# Patient Record
Sex: Female | Born: 1980 | Race: White | Hispanic: No | Marital: Single | Smoking: Current every day smoker
Health system: Southern US, Community
[De-identification: ages and names within clinical notes are randomized; demographics above are authoritative.]

## PROBLEM LIST (undated history)

## (undated) DIAGNOSIS — I10 Essential (primary) hypertension: Secondary | ICD-10-CM

## (undated) DIAGNOSIS — C569 Malignant neoplasm of unspecified ovary: Secondary | ICD-10-CM

## (undated) DIAGNOSIS — F419 Anxiety disorder, unspecified: Secondary | ICD-10-CM

---
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
## 2002-03-16 ENCOUNTER — Other Ambulatory Visit: Admission: RE | Admit: 2002-03-16 | Discharge: 2002-03-16 | Payer: Self-pay | Admitting: Obstetrics and Gynecology

## 2002-03-17 ENCOUNTER — Encounter: Payer: Self-pay | Admitting: Obstetrics and Gynecology

## 2002-03-17 ENCOUNTER — Ambulatory Visit (HOSPITAL_COMMUNITY): Admission: RE | Admit: 2002-03-17 | Discharge: 2002-03-17 | Payer: Self-pay | Admitting: Obstetrics and Gynecology

## 2002-03-23 ENCOUNTER — Emergency Department (HOSPITAL_COMMUNITY): Admission: EM | Admit: 2002-03-23 | Discharge: 2002-03-23 | Payer: Self-pay | Admitting: Emergency Medicine

## 2002-04-07 ENCOUNTER — Ambulatory Visit (HOSPITAL_COMMUNITY): Admission: RE | Admit: 2002-04-07 | Discharge: 2002-04-07 | Payer: Self-pay | Admitting: Obstetrics and Gynecology

## 2002-04-07 ENCOUNTER — Encounter: Payer: Self-pay | Admitting: Obstetrics and Gynecology

## 2002-06-23 ENCOUNTER — Ambulatory Visit (HOSPITAL_COMMUNITY): Admission: RE | Admit: 2002-06-23 | Discharge: 2002-06-23 | Payer: Self-pay | Admitting: Obstetrics and Gynecology

## 2002-06-23 ENCOUNTER — Encounter: Payer: Self-pay | Admitting: Obstetrics and Gynecology

## 2002-06-30 ENCOUNTER — Inpatient Hospital Stay (HOSPITAL_COMMUNITY): Admission: AD | Admit: 2002-06-30 | Discharge: 2002-06-30 | Payer: Self-pay | Admitting: Obstetrics and Gynecology

## 2002-07-18 ENCOUNTER — Inpatient Hospital Stay (HOSPITAL_COMMUNITY): Admission: AD | Admit: 2002-07-18 | Discharge: 2002-07-18 | Payer: Self-pay | Admitting: Obstetrics and Gynecology

## 2002-07-20 ENCOUNTER — Encounter: Payer: Self-pay | Admitting: Obstetrics and Gynecology

## 2002-07-20 ENCOUNTER — Inpatient Hospital Stay (HOSPITAL_COMMUNITY): Admission: AD | Admit: 2002-07-20 | Discharge: 2002-07-20 | Payer: Self-pay | Admitting: Obstetrics and Gynecology

## 2002-07-21 ENCOUNTER — Encounter: Admission: RE | Admit: 2002-07-21 | Discharge: 2002-07-21 | Payer: Self-pay | Admitting: Obstetrics and Gynecology

## 2002-07-25 ENCOUNTER — Encounter: Admission: RE | Admit: 2002-07-25 | Discharge: 2002-07-25 | Payer: Self-pay | Admitting: Obstetrics and Gynecology

## 2002-07-25 ENCOUNTER — Ambulatory Visit (HOSPITAL_COMMUNITY): Admission: RE | Admit: 2002-07-25 | Discharge: 2002-07-25 | Payer: Self-pay | Admitting: *Deleted

## 2002-07-28 ENCOUNTER — Encounter: Admission: RE | Admit: 2002-07-28 | Discharge: 2002-07-28 | Payer: Self-pay | Admitting: Obstetrics and Gynecology

## 2002-08-01 ENCOUNTER — Inpatient Hospital Stay (HOSPITAL_COMMUNITY): Admission: AD | Admit: 2002-08-01 | Discharge: 2002-08-01 | Payer: Self-pay | Admitting: Obstetrics and Gynecology

## 2002-08-04 ENCOUNTER — Encounter: Admission: RE | Admit: 2002-08-04 | Discharge: 2002-08-04 | Payer: Self-pay | Admitting: Obstetrics and Gynecology

## 2002-08-07 ENCOUNTER — Inpatient Hospital Stay (HOSPITAL_COMMUNITY): Admission: AD | Admit: 2002-08-07 | Discharge: 2002-08-11 | Payer: Self-pay | Admitting: Obstetrics and Gynecology

## 2002-08-07 ENCOUNTER — Encounter (INDEPENDENT_AMBULATORY_CARE_PROVIDER_SITE_OTHER): Payer: Self-pay | Admitting: *Deleted

## 2002-08-12 ENCOUNTER — Encounter: Admission: RE | Admit: 2002-08-12 | Discharge: 2002-09-11 | Payer: Self-pay | Admitting: Obstetrics and Gynecology

## 2003-04-25 ENCOUNTER — Other Ambulatory Visit: Admission: RE | Admit: 2003-04-25 | Discharge: 2003-04-25 | Payer: Self-pay | Admitting: Obstetrics and Gynecology

## 2003-05-04 ENCOUNTER — Ambulatory Visit (HOSPITAL_COMMUNITY): Admission: RE | Admit: 2003-05-04 | Discharge: 2003-05-04 | Payer: Self-pay | Admitting: Obstetrics and Gynecology

## 2004-10-03 ENCOUNTER — Ambulatory Visit: Payer: Self-pay | Admitting: Family Medicine

## 2005-03-31 ENCOUNTER — Other Ambulatory Visit: Admission: RE | Admit: 2005-03-31 | Discharge: 2005-03-31 | Payer: Self-pay | Admitting: Obstetrics and Gynecology

## 2005-07-30 ENCOUNTER — Inpatient Hospital Stay (HOSPITAL_COMMUNITY): Admission: AD | Admit: 2005-07-30 | Discharge: 2005-07-30 | Payer: Self-pay | Admitting: Obstetrics and Gynecology

## 2005-09-01 ENCOUNTER — Other Ambulatory Visit: Admission: RE | Admit: 2005-09-01 | Discharge: 2005-09-01 | Payer: Self-pay | Admitting: Obstetrics and Gynecology

## 2005-09-29 ENCOUNTER — Inpatient Hospital Stay (HOSPITAL_COMMUNITY): Admission: AD | Admit: 2005-09-29 | Discharge: 2005-09-29 | Payer: Self-pay | Admitting: Obstetrics and Gynecology

## 2005-10-02 ENCOUNTER — Inpatient Hospital Stay (HOSPITAL_COMMUNITY): Admission: RE | Admit: 2005-10-02 | Discharge: 2005-10-05 | Payer: Self-pay | Admitting: Obstetrics and Gynecology

## 2005-10-09 ENCOUNTER — Inpatient Hospital Stay (HOSPITAL_COMMUNITY): Admission: AD | Admit: 2005-10-09 | Discharge: 2005-10-09 | Payer: Self-pay | Admitting: Obstetrics and Gynecology

## 2005-10-13 ENCOUNTER — Ambulatory Visit (HOSPITAL_COMMUNITY): Admission: RE | Admit: 2005-10-13 | Discharge: 2005-10-13 | Payer: Self-pay | Admitting: Obstetrics and Gynecology

## 2007-01-25 ENCOUNTER — Emergency Department (HOSPITAL_COMMUNITY): Admission: EM | Admit: 2007-01-25 | Discharge: 2007-01-25 | Payer: Self-pay | Admitting: Emergency Medicine

## 2007-05-07 ENCOUNTER — Inpatient Hospital Stay (HOSPITAL_COMMUNITY): Admission: EM | Admit: 2007-05-07 | Discharge: 2007-05-08 | Payer: Self-pay | Admitting: Emergency Medicine

## 2007-09-06 ENCOUNTER — Emergency Department (HOSPITAL_COMMUNITY): Admission: EM | Admit: 2007-09-06 | Discharge: 2007-09-07 | Payer: Self-pay | Admitting: Emergency Medicine

## 2009-10-27 ENCOUNTER — Ambulatory Visit: Payer: Self-pay | Admitting: Diagnostic Radiology

## 2009-10-27 ENCOUNTER — Emergency Department (HOSPITAL_BASED_OUTPATIENT_CLINIC_OR_DEPARTMENT_OTHER): Admission: EM | Admit: 2009-10-27 | Discharge: 2009-10-27 | Payer: Self-pay | Admitting: Emergency Medicine

## 2010-05-01 LAB — URINALYSIS, ROUTINE W REFLEX MICROSCOPIC
Glucose, UA: NEGATIVE mg/dL
pH: 6 (ref 5.0–8.0)

## 2010-05-01 LAB — URINE MICROSCOPIC-ADD ON

## 2010-07-01 NOTE — Discharge Summary (Signed)
NAMEISRAEL, WUNDER           ACCOUNT NO.:  [id number]   MEDICAL RECORD NO.:  [id number]          PATIENT TYPE:  INP   LOCATION:  A228                          FACILITY:  [facility]   PHYSICIAN:  Dorris Singh, DO    DATE OF BIRTH:  [birth year]   DATE OF ADMISSION:  05/07/2007  DATE OF DISCHARGE:  03/22/2009LH                               DISCHARGE SUMMARY   DIAGNOSIS:  Acetaminophen overdose/suicide attempt.   DISCHARGE DIAGNOSES:  1. Acetaminophen overdose/suicide attempt.  2. Tobacco abuse.   PRIMARY CARE PHYSICIAN:  Esaw Grandchild, MD   HISTORY OF PRESENT ILLNESS:  To summarize, the patient is a 30 year old  woman who was brought to the emergency room via EMS after attempted  suicide with acetaminophen overdose; she also took Xanax as well as  alcohol.  Her level was toxic.  She was given charcoal, which produced  vomitus, and then also Mucomyst in the ED.  Her acetaminophen levels  were followed until they went down to normal.  She was given aggressive  IV hydration and the ACT team saw her.  We continue to monitor her vital  signs, which remained stable, as well as her acetaminophen level, which  continue to decrease.  Upon admission, her acetaminophen level was 60.8  and today it is less than 10.  For March 22, it was determined the  patient has remained stable.  We will go ahead and have her ready for  discharge.  She does have hypokalemia, but this will be due to his  aggressive IV hydration.  We will go ahead and discontinue her IV and  supplement her with p.o. potassium and increase her diet.   MEDICATIONS:  Since she was not any medications at admission, she will  not be sent to [facility] on any medications at this point in  time, other than the recommendation of a nicotine patch when she is over  at [facility].   CONDITION ON DISCHARGE:  Her condition is stable.   DISPOSITION:  Her disposition will be determined by the ACT team at this  point in  time, which should be to the behavioral health facility.      Dorris Singh, DO  Electronically Signed     CB/MEDQ  D:  05/08/2007  T:  05/08/2007  Job:  [id number]
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
## 2010-07-01 NOTE — Discharge Summary (Signed)
[name], Sarah [name]           ACCOUNT NO.:  [id number]   MEDICAL RECORD NO.:  [id number]          PATIENT TYPE:  INP   LOCATION:  A228                          FACILITY:  [facility]   PHYSICIAN:  Dorris Singh, DO    DATE OF BIRTH:  05-10-1980   DATE OF ADMISSION:  05/07/2007  DATE OF DISCHARGE:  LH                               DISCHARGE SUMMARY   NO DICTATION.      Dorris Singh, DO  Electronically Signed     CB/MEDQ  D:  05/08/2007  T:  05/08/2007  Job:  [id number]

## 2010-07-04 NOTE — Discharge Summary (Signed)
Sarah [name], Sarah [name]                     ACCOUNT NO.:  [id number]   MEDICAL RECORD NO.:  [id number]                   PATIENT TYPE:  INP   LOCATION:  9125                                 FACILITY:  [facility]   PHYSICIAN:  Zenaida Niece, M.D.             DATE OF BIRTH:  [birth year]   DATE OF ADMISSION:  08/07/2002  DATE OF DISCHARGE:  08/11/2002                                 DISCHARGE SUMMARY   ADMISSION DIAGNOSES:  1. Intrauterine pregnancy at 36 weeks, breech.  2. Intrauterine growth retardation.   DISCHARGE DIAGNOSES:  1. Intrauterine pregnancy at 36 weeks, breech.  2. Intrauterine growth retardation.   PROCEDURE:  Primary low-transverse cesarean section.   HISTORY AND PHYSICAL:  This is a 30 year old white female gravida 2, para 1-  0-0-1 with an EGA of [redacted] weeks, who presents for elective cesarean section  due to breech presentation and suspected IUGR.  Prenatal care complicated by  late care at approximately 15 weeks, persistent breech presentation.  Ultrasound on May 7 with an estimated fetal weight of approximately 1170  grams and an AFI of 10.9 with a followup ultrasound consistent with still  IUGR.  She has had reactive nonstress test for this and has been treated  with Zantac for reflux.   PRENATAL LABS:  Blood type is O negative with a negative antibody screen.  RPR nonreactive.  Rubella immune.  Hepatitis B surface antigen is negative.  HIV negative.  Gonorrhea and Chlamydia negative.  One hour Glucola is 70.   PAST OB HISTORY:  In 2002, a vaginal delivery at 36 weeks, 5 pounds 1 ounce,  complicated by pre-eclampsia.   ALLERGIES:  1. KEFLEX.  2. AUGMENTIN.   MEDICATIONS:  Zantac 150 mg b.i.d.   SOCIAL HISTORY:  She does smoke less than a one-half pack of cigarettes a  day and is single.   FAMILY HISTORY:  Patient has a niece that died with Hurler syndrome.   PHYSICAL EXAMINATION:  VITAL SIGNS:  Afebrile, stable vital signs.  ABDOMEN:  Gravid with an  estimated fetal weight of 5 pounds and ultrasound  confirms breech presentation.   HOSPITAL COURSE:  Patient was admitted and underwent a primary low-  transverse cesarean section without extensions on her spinal anesthesia with  an estimated blood loss of 800 mL.  Patient had normal anatomy and delivered  a viable female infant with Apgars of 8 and 9 and a weight of 4 pounds 2  ounces and was in the frank breech presentation.   Postoperatively, patient did well, was rapidly able to ambulate and tolerate  a regular diet.   Pre-delivery hemoglobin 13.4, post-delivery 11.5.   Baby spent the first three days in the NICU due to difficulty controlling  blood sugars but was then transferred back to the newborn nursery.   On the morning of postoperative day #4, the patient was felt to be stable  enough for discharge home.  Her  incision was healing well and her __________  subcuticular suture was removed.   DISCHARGE INSTRUCTIONS:  1. Regular diet.  2. Pelvic rest.  3. No strenuous activity.  4. Follow up is in 10 days for an incision check.   MEDICATIONS:  1. Percocet #30, 1-2 p.o. q.4-6h. p.r.n. pain.  2. Over the counter Motrin as needed.   She is also given our discharge pamphlet.                                                Zenaida Niece, M.D.    TDM/MEDQ  D:  08/11/2002  T:  08/12/2002  Job:  [id number]

## 2010-07-04 NOTE — Discharge Summary (Signed)
[facility]  Patient:    Sarah [name], Sarah [name]                    MRN: [id number] Adm. Date:  [id number] Disc. Date: 08/20/00 Attending:  Michaele Offer                           Discharge Summary  ADMISSION DIAGNOSES:          1. Intrauterine pregnancy at 36 weeks.                               2. Preeclampsia.  DISCHARGE DIAGNOSES:          1. Intrauterine pregnancy at 36 weeks.                               2. Preeclampsia.  PROCEDURES:                   Spontaneous vaginal delivery.  COMPLICATIONS:                None.  CONSULTATIONS:                None.  HISTORY AND PHYSICAL:         This is an 30 year old white female, gravida 1, para 0 with an EGA of [redacted] weeks by second trimester ultrasound with an [facility] of July 30 who presents for induction due to PIH/preeclampsia and a favorable cervix. The patient has had minimal symptoms but persistently elevated blood pressure up to 170s/120s with 2+ proteinuria and 3+ edema in the office today. She has had normal labs, a reactive nonstress test, and normal growth by the ultrasound with an estimated fetal weight of 2500 g and a normal AFI. Prenatal care complicated by a second trimester upper respiratory infection treated with Zithromax and the elevated blood pressures with intermittent proteinuria since 34 weeks.  PRENATAL LABORATORY DATA:     Blood type O negative with a negative antibody screen, RPR nonreactive, rubella immune, hepatitis B surface antigen negative, HIV negative, gonorrhea and Chlamydia negative, triple screen normal, one-hour glucola 112.  PAST MEDICAL HISTORY:         Significant only for allergies listed below.  ALLERGIES:                    KEFLEX and AUGMENTIN.  SOCIAL HISTORY:               She is single and does smoke one half pack of cigarettes a day.  PHYSICAL EXAMINATION:         She was afebrile with blood pressures of up to 150s/110. Fetal heart tracing was  reactive with irregular contractions. Abdomen was gravid, nontender with an estimated fetal weight of less than 6 pounds. Extremities had 3+ edema, were nontender, DTRs were 2/4 and symmetric and she had no clonus. Vaginal exam in the hospital was 3/80/0 with a vertex presentation and an adequate pelvis, and on my first exam in the hospital, I was able to perform artificial rupture of membranes which revealed clear amniotic fluid.  HOSPITAL COURSE:              The patient was admitted and started on magnesium sulfate for seizure prophylaxis and started on low-dose Pitocin. With that, on my first  exam, I was able to perform artificial rupture of membranes. As I just did her group B strep culture in the office on the day of admission, she was started on clindamycin for group B strep prophylaxis. She continued to labor and received an epidural. She progressed to complete and pushed well. On the evening of July 2, she had an SVD of a viable female infant with Apgars of 8 and 9 that weighed 5 pounds 1 ounce over bilateral labial lacerations and through a shoulder cord. Placenta delivered spontaneous, was intact, and was sent to pathology. Her labial lacerations were repaired partially with 3-0 Vicryl for hemostasis. Estimated blood loss was 500 cc. After delivery the patient complained of dyspnea, although her O2 saturation was 99% on room air. Reflexes were 3/4, not consistent with magnesium toxicity. Her blood pressure did go up to 160-170/120 again and this was treated with 20 mg of labetalol which controlled her blood pressure and relieved her symptoms. She was continued on the magnesium sulfate. Early on the morning of July 3 she had some persistent uterine bleeding and this was treated with a shot of Hemabate which did resolve the bleeding.  On the morning of postpartum day #1, blood pressures were excellent at 130s-140s/70-100 and she had very good urine output. Fundus was  firm. Extremities had 2+ edema and DTRs were still 3/4 on the magnesium and she had no clonus. Labs revealed a hemoglobin that went from 12.8 to 10.3, platelets had gone from 165,000 down to 106,000. Creatinine was stable at 0.7. SGOT was up to 150. SGPT was up to 56. Uric acid was up to 5.6 and LDH was up to 855. She was continued on the magnesium sulfate throughout July 3 for reevaluation that evening. Dr. Ambrose Mantle evaluated her that evening and due to some respiratory distress and the fact that she had abnormal labs, he elected to continue her on her magnesium sulfate. Chest x-ray revealed possible fluid overload and she was apparently treated with Lasix. She had excellent diuresis of over two liters on the night of July 3. On the morning of July 4, her blood pressure was still stable and she felt less respiratory distress. Lungs had slightly decreased breath sounds at the right base but no rales. Hemoglobin was down to 8.2 and platelets were down to 87,000. The remainder of her labs were all stable or improved. Her magnesium sulfate was discontinued. She was observed in the AICU and then transferred to the floor. Once she was on the floor she did very well, continued to have normal blood pressures and remained afebrile. On the morning of postpartum day #3, she was felt to be stable enough for discharge home.  CONDITION ON DISCHARGE:       Stable.  DISPOSITION:                  Discharge to home.  DISCHARGE INSTRUCTIONS:       Regular diet. Activity: Pelvic rest.  DISCHARGE FOLLOWUP:           The patient is to follow up in four to six weeks.  DISCHARGE MEDICATIONS:        Percocet and over-the-counter Motrin p.r.n. pain and she is given our discharge pamphlet. DD:  08/20/00 TD:  08/20/00 Job: [id number]

## 2010-07-04 NOTE — Op Note (Signed)
Sarah [name], Sarah [name]                     ACCOUNT NO.:  [id number]   MEDICAL RECORD NO.:  [id number]                   PATIENT TYPE:  INP   LOCATION:  9198                                 FACILITY:  [facility]   PHYSICIAN:  Zenaida Niece, M.D.             DATE OF BIRTH:  [birth year]   DATE OF PROCEDURE:  08/07/2002  DATE OF DISCHARGE:                                 OPERATIVE REPORT   PREOPERATIVE DIAGNOSES:  1. Intrauterine pregnancy at 36 weeks.  2. Breech presentation.  3. Intrauterine growth restriction.   POSTOPERATIVE DIAGNOSES:  1. Intrauterine pregnancy at 36 weeks.  2. Breech presentation.  3. Intrauterine growth restriction.   PROCEDURE:  Primary low transverse cesarean section.   SURGEON:  Zenaida Niece, M.D.   ASSISTANT:  Malachi Pro. Ambrose Mantle, M.D.   ANESTHESIA:  Spinal.   ESTIMATED BLOOD LOSS:  800 mL.   FINDINGS:  The patient had normal female anatomy.  She delivered a viable  female infant with Apgars of 8 and 9 that weighed 4 pounds 2 ounces.   PROCEDURE IN DETAIL:  The patient was taken to the operating room and placed  in the sitting position.  John A. Maisie Fus, M.D., instilled spinal anesthesia  and she was placed in the dorsal supine position with left lateral tilt.  Her abdomen was prepped and draped in the usual sterile fashion and a Foley  catheter inserted.  The level of her anesthesia was found to be just halfway  between pubic and umbilicus.  The skin was infiltrated with 0.25% Marcaine.  Her abdomen was then entered via a standard Pfannenstiel incision without  complications.  The vesicouterine peritoneum was incised and the bladder  flap created digitally.  A 4 cm transverse incision was made in the lower  uterine segment.  This was then extended bilaterally bluntly.  The membranes  were ruptured with an Allis and revealed clear fluid.  The fetal breech was  grasped and delivered through the incision atraumatically.  The baby  delivered very  easily to the shoulders, then the head very easily delivered.  The mouth and nares were suctioned, then the cord was doubly clamped and  cut.  The infant was handed to the awaiting pediatric team.  Cord blood and  a segment of cord for gas were obtained, and the placenta delivered  spontaneously.  The placenta was sent to pathology.  The uterus was wiped  dry with a clean lap pad and all clots and debris removed.  The uterine  incision was inspected and found to be free of extensions.  The uterine  incision was closed with one layer being a running locking layer with #1  chromic.  Bleeding from the left edge was controlled with a figure-of-eight  suture of #1 chromic.  Both tubes and ovaries were inspected and found to be  normal.  The uterine incision was again inspected and found to be  hemostatic.  The subfascial space was irrigated and made hemostatic with  electrocautery.  The superior portion of the rectus muscles was  reapproximated with interrupted sutures of #1 chromic due to prolapsing  omentum.  The fascia was closed in a running fashion starting at both ends  and meeting in the middle with 0 Vicryl.  The subcutaneous tissue was  irrigated and made hemostatic with electrocautery.  The skin was closed with  running subcuticular suture of 4-0 Prolene, followed by Steri-Strips.  The  patient tolerated the procedure well and was taken to the recovery room in  stable condition.  The counts were correct x2, and she was given Clindamycin  after cord clamp.                                               Zenaida Niece, M.D.    TDM/MEDQ  D:  08/07/2002  T:  08/07/2002  Job:  [id number]

## 2010-07-04 NOTE — Op Note (Signed)
Sarah, [name]           ACCOUNT NO.:  [id number]   MEDICAL RECORD NO.:  [id number]          PATIENT TYPE:  AMB   LOCATION:                                FACILITY:  [facility]   PHYSICIAN:  Sherron Monday, MD        DATE OF BIRTH:  10-Aug-1980   DATE OF PROCEDURE:  10/02/2005  DATE OF DISCHARGE:  10/05/2005                                 OPERATIVE REPORT   PREOPERATIVE DIAGNOSES:  1. Intrauterine pregnancy at 37+6 weeks.  2. Repeat cesarean section.  3. Declined VBAC.  4. Spontaneous rupture of membranes.  5. Pregnancy-induced hypertension.   POSTOPERATIVE DIAGNOSES:  1. Intrauterine pregnancy at 37+6 weeks, delivered.  2. Repeat cesarean section.  3. Declined VBAC.  4. Spontaneous rupture of membranes.  5. Pregnancy-induced hypertension.   PROCEDURE:  Repeat low transverse cesarean section by Pfannenstiel.   SURGEON:  Sherron Monday, M.D.   ASSISTANT:  Malachi Pro. Ambrose Mantle, M.D.   ANESTHESIA:  Spinal anesthesia.   COMPLICATIONS:  None.   ESTIMATED BLOOD LOSS:  Was 600 mL.   IV FLUIDS:  Was 2100 mL.   URINE OUTPUT:  Was 300 mL.   INDICATIONS FOR PROCEDURE:  This 30 year old G-3, P-0, 2, 0, 2 at 37+6 weeks  presenting to the office with elevated pressure as well as a spontaneous  rupture of membranes, confirmed by exam.  She has no symptoms of pre-  eclampsia; however, her blood pressures were 144-164/100-115.  She declined  a VBAC and had previously scheduled a repeat cesarean section.   FINDINGS:  A viable female infant delivered at 11:54 a.m. with Apgars of 8 and  9 and a weight of 6 pounds.  Normal uterus, tubes and ovaries.   DESCRIPTION OF PROCEDURE:  The patient was transported to the operating  room.  A spinal anesthesia was placed and found to be adequate.  She was  then prepped and draped in the normal sterile fashion.  A Pfannenstiel skin  incision was made at the level of her previous incision and carried through  to the underlying layer of fascia sharply.   The fascia was nicked in the  midline and the incision was extended laterally with Mayo scissors.  The  superior aspect of the fascial incision was grasped with Kocher clamps and  elevated and the rectus muscles were dissected off of this bluntly and  sharply.  Attention was then turned to the inferior portion of the fascial  incision which was also elevated in a similar fashion.  The rectus muscles  were dissected off of this bluntly and sharply.  The midline was easily  identified as well as the peritoneum.  The peritoneum was entered after  tenting it up with hemostat clamps.  The incision was extended with good  visualization of the bladder.  The vesicouterine peritoneum was visualized  and tented up with smooth pick ups.  The bladder flap was created, the  incision was extended superiorly and inferiorly and the bladder flap was  created digitally and sharply with Metzenbaum scissors.  The uterus was  incised in a transverse fashion.  This incision  was extended manually and  the infant was delivered from a vertex presentation without difficulty.  The  cord was clamped and tied on the field.  The infant was suctioned and handed  off to the waiting pediatric staff.  The uterus was exteriorized  and  cleared of all clots and debris.  Using ring forceps the corners were  identified.  The first layer of closure was placed using #0 Vicryl.  The  second layer was an imbricating layer of the same suture.  Hemostasis was  assured.  The bladder flap and gutters were inspected and irrigated and  found to be hemostatic.  The uterus was returned to the peritoneal cavity.  The fascia was closed with #0 Vicryl in a running fashion.  The subcuticular  adipose layer was made hemostatic with Bovie cautery and the skin was closed  with staples.   The patient tolerated the procedure well.  The sponge, lap and needle counts  were correct x2 at the end of the procedure per the operating staff.       Sherron Monday, MD  Electronically Signed     JB/MEDQ  D:  10/02/2005  T:  10/02/2005  Job:  [id number]

## 2010-07-04 NOTE — Discharge Summary (Signed)
Sarah [name], Sarah [name]           ACCOUNT NO.:  [id number]   MEDICAL RECORD NO.:  [id number]          PATIENT TYPE:  INP   LOCATION:  9105                          FACILITY:  [facility]   PHYSICIAN:  Sherron Monday, MD        DATE OF BIRTH:  May 22, 1980   DATE OF ADMISSION:  10/02/2005  DATE OF DISCHARGE:  10/05/2005                                 DISCHARGE SUMMARY   ADMISSION DIAGNOSES:  1. Intrauterine pregnancy at term.  2. Rupture of membranes.  3. Pregnancy-induced hypertension.  4. Declines vaginal birth after cesarean.  5. Desires repeat cesarean section.   DISCHARGE DIAGNOSES:  1. Intrauterine pregnancy at term, delivered.  2. Rupture of membranes.  3. Pregnancy-induced hypertension.  4. Declines vaginal birth after cesarean.  5. Desires repeat cesarean section.   PROCEDURES:  1. Repeat low transverse cesarean section.  2. Magnesium sulfate therapy for seizure prophylaxis.   HISTORY OF PRESENT ILLNESS:  A 30 year old, G3, P0-2-0-2 at 37-6/7 weeks  with rupture of membranes and irregular contractions.  She states she has  good fetal movement, no vaginal bleeding, no headache, no blurry vision and  no right upper quadrant pain.   PAST MEDICAL HISTORY:  Noncontributory.   PAST SURGICAL HISTORY:  Cesarean section.   PAST OBSTETRICAL HISTORY:  G1 was a 36-week delivery secondary to  preeclampsia.  G2 was a 36-week cesarean section with IUGR.  G3 is present  pregnancy.  No history of abnormal Pap smears.  Remote history of gonorrhea.  Vital signs in the office, her blood pressure was noted to be elevated.  She  had been on bed rest at home.  On admission, her blood pressures were  147/100, 147/104.  Her urine was negative.  Her PIH labs were also negative.   PRENATAL LABORATORY DATA:  Hemoglobin 12.6, platelets 257.  She is O  negative.  Gonorrhea and Chlamydia negative.  RPR nonreactive.  Rubella  immune.  Hepatitis C surface antigen negative.  HIV nonreactive.  In the  office, she was 4cm dilated, 60 percent effaced and  -2 station.   HOSPITAL COURSE:  She was asked to go to [facility] and the section  was scheduled after discussion of the repeat cesarean was discussed with her  and decision was made to proceed.  She tolerated the cesarean section well.  The infant was delivered at 11:54 with Apgar's of 8 and 9, 6 pounds, female.  Postoperatively, he was continued on magnesium sulfate procedure prophylaxis  x24 hours.  Twenty-four hours after delivery, her PIH labs had been checked  again.  Her blood pressure was noted to be better controlled.  Postpartum  hemoglobin was 9.2 and her PIH labs were negative.  Her magnesium sulfate  was discontinued.  She was transferred to the regular floor.  She was  monitored closely throughout the rest of her postpartum course.  She  received a dose of RhoGAM.  She was discharged to home on postop day #3.  Her blood pressures were 129-151/82-94.  She will return to the office in a  week for a blood pressure check.  She was  discharged with iron, Colace,  Percocet and Motrin as well as appropriate discharge instructions and knows  to call with any questions or problems.      Sherron Monday, MD  Electronically Signed     JB/MEDQ  D:  10/05/2005  T:  10/05/2005  Job:  [id number]

## 2010-11-10 LAB — APTT
aPTT: 29
aPTT: 32

## 2010-11-10 LAB — BASIC METABOLIC PANEL
BUN: 5 — ABNORMAL LOW
CO2: 29
Calcium: 8.5
Chloride: 104
Creatinine, Ser: 0.55
GFR calc non Af Amer: >60

## 2010-11-10 LAB — URINALYSIS, ROUTINE W REFLEX MICROSCOPIC
Bilirubin Urine: NEGATIVE
Glucose, UA: NEGATIVE
Ketones, ur: NEGATIVE
Nitrite: NEGATIVE
Protein, ur: NEGATIVE
Urobilinogen, UA: 0.2
pH: 6.5

## 2010-11-10 LAB — RAPID URINE DRUG SCREEN, HOSP PERFORMED
Amphetamines: NOT DETECTED
Barbiturates: NOT DETECTED
Benzodiazepines: NOT DETECTED
Cocaine: NOT DETECTED
Tetrahydrocannabinol: NOT DETECTED

## 2010-11-10 LAB — DIFFERENTIAL
Eosinophils Relative: 2
Lymphs Abs: 3.2
Monocytes Absolute: 0.6
Monocytes Relative: 6
Neutrophils Relative %: 63

## 2010-11-10 LAB — CBC
HCT: 39.5
Hemoglobin: 14
RBC: 4.15

## 2010-11-10 LAB — PROTIME-INR
INR: 0.9
Prothrombin Time: 12.2

## 2010-11-10 LAB — HEPATIC FUNCTION PANEL
Indirect Bilirubin: 0.3
Total Protein: 6

## 2010-11-10 LAB — URINE MICROSCOPIC-ADD ON

## 2010-11-10 LAB — ETHANOL: Alcohol, Ethyl (B): 46 — ABNORMAL HIGH

## 2010-11-10 LAB — SALICYLATE LEVEL: Salicylate Lvl: <4

## 2010-11-10 LAB — ACETAMINOPHEN LEVEL
Acetaminophen (Tylenol), Serum: 141.2 — ABNORMAL HIGH
Acetaminophen (Tylenol), Serum: <10 — ABNORMAL LOW

## 2010-11-14 LAB — BASIC METABOLIC PANEL
BUN: 5 — ABNORMAL LOW
Chloride: 103
Creatinine, Ser: 0.67
Glucose, Bld: 113 — ABNORMAL HIGH

## 2010-11-14 LAB — URINALYSIS, ROUTINE W REFLEX MICROSCOPIC
Protein, ur: 100 — AB
Urobilinogen, UA: 1

## 2010-11-14 LAB — DIFFERENTIAL
Basophils Absolute: 0
Basophils Relative: 0
Eosinophils Absolute: 0.1
Eosinophils Relative: 1
Lymphs Abs: 2
Neutrophils Relative %: 69

## 2010-11-14 LAB — CBC
HCT: 47.4 — ABNORMAL HIGH
MCHC: 34.4
MCV: 96
Platelets: 272
RDW: 12.6
WBC: 8.8

## 2010-11-14 LAB — RAPID URINE DRUG SCREEN, HOSP PERFORMED
Benzodiazepines: POSITIVE — AB
Cocaine: NOT DETECTED
Opiates: NOT DETECTED

## 2010-11-14 LAB — URINE MICROSCOPIC-ADD ON

## 2010-11-14 LAB — ETHANOL: Alcohol, Ethyl (B): <5

## 2010-11-24 LAB — URINALYSIS, ROUTINE W REFLEX MICROSCOPIC
Ketones, ur: NEGATIVE
Leukocytes, UA: NEGATIVE
Protein, ur: NEGATIVE
Urobilinogen, UA: 0.2

## 2010-11-24 LAB — URINE CULTURE: Colony Count: 40000

## 2010-11-24 LAB — URINE MICROSCOPIC-ADD ON

## 2011-08-04 ENCOUNTER — Emergency Department (HOSPITAL_BASED_OUTPATIENT_CLINIC_OR_DEPARTMENT_OTHER)
Admission: EM | Admit: 2011-08-04 | Discharge: 2011-08-04 | Disposition: A | Discharge status: Home / Self Care | Payer: Self-pay | Attending: Emergency Medicine | Admitting: Emergency Medicine

## 2011-08-04 ENCOUNTER — Encounter (HOSPITAL_BASED_OUTPATIENT_CLINIC_OR_DEPARTMENT_OTHER): Payer: Self-pay | Admitting: Emergency Medicine

## 2011-08-04 DIAGNOSIS — R030 Elevated blood-pressure reading, without diagnosis of hypertension: Secondary | ICD-10-CM | POA: Insufficient documentation

## 2011-08-04 DIAGNOSIS — I10 Essential (primary) hypertension: Secondary | ICD-10-CM

## 2011-08-04 DIAGNOSIS — F17200 Nicotine dependence, unspecified, uncomplicated: Secondary | ICD-10-CM | POA: Insufficient documentation

## 2011-08-04 LAB — CBC
Hemoglobin: 14.3 g/dL (ref 12.0–15.0)
MCH: 33.6 pg (ref 26.0–34.0)
MCHC: 37 g/dL — ABNORMAL HIGH (ref 30.0–36.0)
Platelets: 301 10*3/uL (ref 150–400)
RBC: 4.25 MIL/uL (ref 3.87–5.11)

## 2011-08-04 LAB — DIFFERENTIAL
Basophils Relative: 0 % (ref 0–1)
Eosinophils Absolute: 0.2 10*3/uL (ref 0.0–0.7)
Neutro Abs: 8.4 10*3/uL — ABNORMAL HIGH (ref 1.7–7.7)
Neutrophils Relative %: 68 % (ref 43–77)

## 2011-08-04 LAB — BASIC METABOLIC PANEL
BUN: 8 mg/dL (ref 6–23)
Calcium: 9.4 mg/dL (ref 8.4–10.5)
GFR calc Af Amer: >90 mL/min (ref >90)
GFR calc non Af Amer: >90 mL/min (ref >90)
Glucose, Bld: 97 mg/dL (ref 70–99)
Potassium: 3.6 mEq/L (ref 3.5–5.1)
Sodium: 140 mEq/L (ref 135–145)

## 2011-08-04 LAB — TROPONIN I: Troponin I: <0.3 ng/mL (ref <0.30)

## 2011-08-04 MED ORDER — CLONIDINE HCL 0.2 MG PO TABS: 0.2000 mg | ORAL_TABLET | Freq: Two times a day (BID) | ORAL | Status: DC

## 2011-08-04 MED ORDER — CLONIDINE HCL 0.1 MG PO TABS
0.2000 mg | ORAL_TABLET | Freq: Once | ORAL | Status: AC
  Administered 2011-08-04: 0.2 mg via ORAL
  Filled 2011-08-04: qty 2

## 2011-08-04 MED ORDER — CLONIDINE HCL 0.1 MG PO TABS
ORAL_TABLET | ORAL | Status: AC
  Filled 2011-08-04: qty 1

## 2011-08-04 NOTE — ED Notes (Signed)
Pt has been having spells of HA, dizziness, blurred vision and CP for 1-2 weeks.  Getting worse as of last night. Found out her bp was up today.

## 2011-08-04 NOTE — ED Provider Notes (Signed)
History     CSN: [id number]  Arrival date & time 08/04/11  2044   None     Chief Complaint  Patient presents with  . Hypertension  . Blurred Vision  . Dizziness    (Consider location/radiation/quality/duration/timing/severity/associated sxs/prior treatment) Patient is a 31 y.o. female presenting with hypertension. The history is provided by the patient. No language interpreter was used.  Hypertension This is a new problem. The current episode started today. The problem occurs constantly. The problem has been unchanged. Associated symptoms include fatigue, headaches and weakness. Nothing aggravates the symptoms. She has tried nothing for the symptoms. The treatment provided moderate relief.    History reviewed. No pertinent past medical history.  Past Surgical History  Procedure Date  . Cesarean section     No family history on file.  History  Substance Use Topics  . Smoking status: Current Everyday Smoker -- 1.0 packs/day  . Smokeless tobacco: Never Used  . Alcohol Use: No     Occ    OB History    Grav Para Term Preterm Abortions TAB SAB Ect Mult Living                  Review of Systems  Constitutional: Positive for fatigue.  Neurological: Positive for weakness and headaches.  All other systems reviewed and are negative.    Allergies  Review of patient's allergies indicates no known allergies.  Home Medications   Current Outpatient Rx  Name Route Sig Dispense Refill  . ACETAMINOPHEN 500 MG PO TABS Oral Take 1,000 mg by mouth every 6 (six) hours as needed. Patient used this medication for her chest pain.    . IBUPROFEN 200 MG PO TABS Oral Take 400 mg by mouth every 6 (six) hours as needed. Patient used this medication for her stomach pain.      BP 183/113  Pulse 97  Temp 99.4 F (37.4 C) (Oral)  Resp 18  Ht 5\' 2"  (1.575 m)  Wt 132 lb (59.875 kg)  BMI 24.14 kg/m2  SpO2 100%  LMP 07/21/2011  Physical Exam  Nursing note and vitals  reviewed. Constitutional: She is oriented to person, place, and time. She appears well-developed and well-nourished.  HENT:  Head: Normocephalic and atraumatic.  Right Ear: External ear normal.  Left Ear: External ear normal.  Nose: Nose normal.  Mouth/Throat: Oropharynx is clear and moist.  Eyes: Conjunctivae and EOM are normal. Pupils are equal, round, and reactive to light.  Neck: Normal range of motion. Neck supple.  Cardiovascular: Normal rate, regular rhythm and normal heart sounds.   Pulmonary/Chest: Effort normal and breath sounds normal.  Abdominal: Soft. Bowel sounds are normal.  Musculoskeletal: Normal range of motion.  Neurological: She is alert and oriented to person, place, and time. She has normal reflexes.  Skin: Skin is warm.  Psychiatric: She has a normal mood and affect.    ED Course  Procedures (including critical care time)  Labs Reviewed - No data to display No results found.   No diagnosis found.    MDM   Date: 08/04/2011  Rate: 91  Rhythm: normal sinus rhythm  QRS Axis: normal  Intervals: normal  ST/T Wave abnormalities: normal  Conduction Disutrbances:none  Narrative Interpretation:   Old EKG Reviewed: none available         Elson Areas, [place] 08/04/11 2140

## 2011-08-04 NOTE — ED Provider Notes (Signed)
Patient with negative labs and it improved oral of her symptoms after clonidine. She states that she's had hypertension since her daughter was born but because of losing insurance she just stopped taking the medication. She has no focal deficits and is otherwise well-appearing. Will send her home with a prescription for clonidine because she's been on hydrochlorothiazide in the past and not felt well after taking it. Also she was given referral for her PMD.  Gwyneth Sprout, MD 08/04/11 2304

## 2011-08-04 NOTE — Discharge Instructions (Signed)
Hypertension Information As your heart beats, it forces blood through your arteries. This force is your blood pressure. If the pressure is too high, it is called hypertension (HTN) or high blood pressure. HTN is dangerous because you may have it and not know it. High blood pressure may mean that your heart has to work harder to pump blood. Your arteries may be narrow or stiff. The extra work puts you at risk for heart disease, stroke, and other problems.  Blood pressure consists of two numbers, a higher number over a lower, 110/72, for example. It is stated as "110 over 72." The ideal is below 120 for the top number (systolic) and under 80 for the bottom (diastolic).  You should pay close attention to your blood pressure if you have certain conditions such as:  Heart failure.   Prior heart attack.   Diabetes   Chronic kidney disease.   Prior stroke.   Multiple risk factors for heart disease.  To see if you have HTN, your blood pressure should be measured while you are seated with your arm held at the level of the heart. It should be measured at least twice. A one-time elevated blood pressure reading (especially in the Emergency Department) does not mean that you need treatment. There may be conditions in which the blood pressure is different between your right and left arms. It is important to see your caregiver soon for a recheck. Most people have essential hypertension which means that there is not a specific cause. This type of high blood pressure may be lowered by changing lifestyle factors such as:  Stress.   Smoking.   Lack of exercise.   Excessive weight.   Drug/tobacco/alcohol use.   Eating less salt.  Most people do not have symptoms from high blood pressure until it has caused damage to the body. Effective treatment can often prevent, delay or reduce that damage. TREATMENT  Treatment for high blood pressure, when a cause has been identified, is directed at the cause. There  are a large number of medications to treat HTN. These fall into several categories, and your caregiver will help you select the medicines that are best for you. Medications may have side effects. You should review side effects with your caregiver. If your blood pressure stays high after you have made lifestyle changes or started on medicines,   Your medication(s) may need to be changed.   Other problems may need to be addressed.   Be certain you understand your prescriptions, and know how and when to take your medicine.   Be sure to follow up with your caregiver within the time frame advised (usually within two weeks) to have your blood pressure rechecked and to review your medications.   If you are taking more than one medicine to lower your blood pressure, make sure you know how and at what times they should be taken. Taking two medicines at the same time can result in blood pressure that is too low.  Document Released: 04/07/2005 Document Revised: 10/15/2010 Document Reviewed: 04/14/2007 [facility] Patient Information 2012 [place], [place].  Insufficient Money for Medicine Contact [employer]:  call "211" or [facility] [phone].  No Primary Care Doctor Call [facility]  [phone] Other agencies that provide inexpensive medical care    [facility]  [phone]    [facility]  [phone]    [facility]  [phone]    [facility]  [phone]    [facility]  [phone]    [facility]  [phone]

## 2011-08-05 NOTE — ED Notes (Signed)
Pt reports intermittent CP dizziness blurred vision and increased BP x 1-2 weeks pt also with a C/O confusion alert and oriented MAE no slurred speech or facial drooping grips equal bilat.

## 2011-08-05 NOTE — ED Provider Notes (Signed)
Medical screening examination/treatment/procedure(s) were conducted as a shared visit with non-physician practitioner(s) and myself.  I personally evaluated the patient during the encounter   Gwyneth Sprout, MD 08/05/11 [phone]

## 2011-09-22 ENCOUNTER — Emergency Department (HOSPITAL_BASED_OUTPATIENT_CLINIC_OR_DEPARTMENT_OTHER): Payer: Self-pay

## 2011-09-22 ENCOUNTER — Emergency Department (HOSPITAL_BASED_OUTPATIENT_CLINIC_OR_DEPARTMENT_OTHER)
Admission: EM | Admit: 2011-09-22 | Discharge: 2011-09-22 | Disposition: A | Discharge status: Home / Self Care | Payer: Self-pay | Attending: Emergency Medicine | Admitting: Emergency Medicine

## 2011-09-22 ENCOUNTER — Encounter (HOSPITAL_BASED_OUTPATIENT_CLINIC_OR_DEPARTMENT_OTHER): Payer: Self-pay | Admitting: *Deleted

## 2011-09-22 DIAGNOSIS — R42 Dizziness and giddiness: Secondary | ICD-10-CM | POA: Insufficient documentation

## 2011-09-22 DIAGNOSIS — R200 Anesthesia of skin: Secondary | ICD-10-CM

## 2011-09-22 DIAGNOSIS — F17200 Nicotine dependence, unspecified, uncomplicated: Secondary | ICD-10-CM | POA: Insufficient documentation

## 2011-09-22 DIAGNOSIS — R209 Unspecified disturbances of skin sensation: Secondary | ICD-10-CM | POA: Insufficient documentation

## 2011-09-22 DIAGNOSIS — I10 Essential (primary) hypertension: Secondary | ICD-10-CM | POA: Insufficient documentation

## 2011-09-22 DIAGNOSIS — R0602 Shortness of breath: Secondary | ICD-10-CM | POA: Insufficient documentation

## 2011-09-22 DIAGNOSIS — R079 Chest pain, unspecified: Secondary | ICD-10-CM | POA: Insufficient documentation

## 2011-09-22 DIAGNOSIS — R51 Headache: Secondary | ICD-10-CM | POA: Insufficient documentation

## 2011-09-22 HISTORY — DX: Anxiety disorder, unspecified: F41.9

## 2011-09-22 HISTORY — DX: Essential (primary) hypertension: I10

## 2011-09-22 LAB — COMPREHENSIVE METABOLIC PANEL
ALT: 19 U/L (ref 0–35)
AST: 21 U/L (ref 0–37)
Alkaline Phosphatase: 104 U/L (ref 39–117)
CO2: 29 mEq/L (ref 19–32)
Calcium: 10 mg/dL (ref 8.4–10.5)
Chloride: 99 mEq/L (ref 96–112)
GFR calc non Af Amer: >90 mL/min (ref >90)
Potassium: 3.5 mEq/L (ref 3.5–5.1)
Sodium: 138 mEq/L (ref 135–145)
Total Bilirubin: 0.3 mg/dL (ref 0.3–1.2)

## 2011-09-22 LAB — CBC
Platelets: 337 10*3/uL (ref 150–400)
RBC: 4.69 MIL/uL (ref 3.87–5.11)
RDW: 11.2 % — ABNORMAL LOW (ref 11.5–15.5)
WBC: 9.2 10*3/uL (ref 4.0–10.5)

## 2011-09-22 LAB — DIFFERENTIAL
Basophils Absolute: 0 10*3/uL (ref 0.0–0.1)
Lymphocytes Relative: 29 % (ref 12–46)
Lymphs Abs: 2.6 10*3/uL (ref 0.7–4.0)
Neutro Abs: 5.9 10*3/uL (ref 1.7–7.7)

## 2011-09-22 LAB — RAPID URINE DRUG SCREEN, HOSP PERFORMED
Barbiturates: NOT DETECTED
Cocaine: NOT DETECTED
Tetrahydrocannabinol: NOT DETECTED

## 2011-09-22 LAB — GLUCOSE, CAPILLARY: Glucose-Capillary: 126 mg/dL — ABNORMAL HIGH (ref 70–99)

## 2011-09-22 LAB — PROTIME-INR: INR: 0.96 (ref 0.00–1.49)

## 2011-09-22 MED ORDER — SODIUM CHLORIDE 0.9 % IV BOLUS (SEPSIS): 1000.0000 mL | Freq: Once | INTRAVENOUS | Status: DC

## 2011-09-22 NOTE — ED Notes (Signed)
Pt took Clonidine 02.mg PO (pt's medication ok per Dr. Alto Denver)

## 2011-09-22 NOTE — ED Notes (Signed)
MD at bedside. 

## 2011-09-22 NOTE — ED Notes (Signed)
Patient transported to CT via stretcher.

## 2011-09-22 NOTE — ED Notes (Signed)
Awaiting MRI result/disposition.

## 2011-09-22 NOTE — ED Notes (Signed)
MD at bedside speaking with pt. New orders received.

## 2011-09-22 NOTE — ED Notes (Signed)
Recollect PT/PTT per lab request due to insufficient amount of blood in tube.

## 2011-09-22 NOTE — ED Notes (Addendum)
Pt reports she was seen in ED previously for hypertension and prescribed Clonidine.  States she takes the medication and it makes her sleepy.  She reports BP remains elevated and she is anxious. Denies chest pain at present time.

## 2011-09-22 NOTE — ED Notes (Signed)
Pt returned from MRI.  No change in assessment.

## 2011-09-22 NOTE — ED Notes (Signed)
Patient transported to MRI 

## 2011-09-22 NOTE — ED Notes (Signed)
Labs collected.  Pt informed of plan of care.

## 2011-09-22 NOTE — ED Notes (Signed)
BS 100

## 2011-09-22 NOTE — ED Notes (Addendum)
Patient states she has been seen here for hypertension and treated with Clonidine.  States she has not followed up with a pcp due to lack of insurance.  States last night she felt flushed and full and took her bp which was elevated 163/133, sob and tingling of right hand.  States she had been taking only one half of the prescribed medications since she can not get in to see a pcp.

## 2011-09-23 NOTE — ED Provider Notes (Signed)
History     CSN: [id number]  Arrival date & time 09/22/11  1412   First MD Initiated Contact with Patient 09/22/11 1506      Chief Complaint  Patient presents with  . Hypertension    (Consider location/radiation/quality/duration/timing/severity/associated sxs/prior treatment) HPI Patient is a 31 year old female with history of hypertension who presents today complaining of elevated blood pressure as well as an episode of chest pain last night and current decreased sensation over the right side of her face as well as over her right upper and lower extremities. Patient has been treated with clonidine in the past. She reports that over prescription was 0.2 mg twice a day that she's only been taking half of this as it makes her sleepy. Patient has not followed up with her primary care physician. Blood pressure today was 163/115 on presentation. Patient reports that last night she had chest pain with associated flushing of her face and chest wall. This resolved with rest. Patient also says that since last night she's noted having right-sided neurologic symptoms mentioned previously. She has no facial droop. Patient denies any other neurologic symptoms. Nothing has made her symptoms better or worse. Patient does have a history of smoking but denies any known history of diabetes, hyperlipidemia, or family coronary artery disease before the age of 70. She also knows of no history in her family of very an individual strokes. There are no other associated or modifying factors. Past Medical History  Diagnosis Date  . Hypertension   . Anxiety     Past Surgical History  Procedure Date  . Cesarean section     History reviewed. No pertinent family history.  History  Substance Use Topics  . Smoking status: Current Everyday Smoker -- 1.0 packs/day  . Smokeless tobacco: Never Used  . Alcohol Use: No     Occ    OB History    Grav Para Term Preterm Abortions TAB SAB Ect Mult Living                  Review of Systems  Constitutional: Negative.   HENT: Negative.   Eyes: Negative.   Respiratory: Negative.   Cardiovascular: Positive for chest pain.  Gastrointestinal: Negative.   Genitourinary: Negative.   Musculoskeletal: Negative.   Skin: Negative.   Neurological: Positive for numbness.  Hematological: Negative.   Psychiatric/Behavioral: Negative.   All other systems reviewed and are negative.    Allergies  Review of patient's allergies indicates no known allergies.  Home Medications   Current Outpatient Rx  Name Route Sig Dispense Refill  . ACETAMINOPHEN 500 MG PO TABS Oral Take 1,000 mg by mouth every 6 (six) hours as needed. For headache.    . CLONIDINE HCL 0.2 MG PO TABS Oral Take 0.2 mg by mouth 2 (two) times daily.    . IBUPROFEN 200 MG PO TABS Oral Take 400 mg by mouth every 6 (six) hours as needed. Patient used this medication for her stomach pain.      BP 155/104  Pulse 61  Temp 98.1 F (36.7 C) (Oral)  Resp 16  SpO2 100%  LMP 09/15/2011  Physical Exam  Nursing note and vitals reviewed. GEN: Well-developed, well-nourished female in no distress HEENT: Atraumatic, normocephalic. Oropharynx clear without erythema EYES: PERRLA BL, no scleral icterus. NECK: Trachea midline, no meningismus CV: regular rate and rhythm. No murmurs, rubs, or gallops PULM: No respiratory distress.  No crackles, wheezes, or rales. GI: soft, non-tender. No guarding, rebound, or tenderness. +  bowel sounds  GU: deferred Neuro: cranial nerves grossly 2-12 intact aside from reported decreased sensation over the right side of the face and all branches of the trigeminal nerve, no abnormalities of strength, A and O x 3, patient complains of decreased sensation to light touch over the right upper and lower extremities. No facial droop, no dysmetria on finger to nose task bilaterally. MSK: Patient moves all 4 extremities symmetrically, no deformity, edema, or injury noted Skin: No rashes  petechiae, purpura, or jaundice Psych: no abnormality of mood   ED Course  Procedures (including critical care time)  Indication: chest pain Please note this EKG was reviewed extemporaneously by myself.   Date: 09/22/2011  Rate: 79  Rhythm: normal sinus rhythm  QRS Axis: normal  Intervals: normal  ST/T Wave abnormalities: nonspecific T wave changes  Conduction Disutrbances:none  Narrative Interpretation: New T-wave inversion noted in lead V3 compared to prior as well as some T-wave flattening in leads 3 and aVF  Old EKG Reviewed: changes noted     Labs Reviewed  CBC - Abnormal; Notable for the following:    Hemoglobin 15.4 (*)     MCHC 36.1 (*)     RDW 11.2 (*)     All other components within normal limits  URINE RAPID DRUG SCREEN (HOSP PERFORMED) - Abnormal; Notable for the following:    Benzodiazepines POSITIVE (*)     All other components within normal limits  GLUCOSE, CAPILLARY - Abnormal; Notable for the following:    Glucose-Capillary 126 (*)     All other components within normal limits  GLUCOSE, CAPILLARY - Abnormal; Notable for the following:    Glucose-Capillary 309 (*)     All other components within normal limits  DIFFERENTIAL  COMPREHENSIVE METABOLIC PANEL  TROPONIN I  PROTIME-INR  APTT  TROPONIN I   Ct Head Wo Contrast  09/22/2011  *RADIOLOGY REPORT*  Clinical Data: Hypertension, shortness of breath.  CT HEAD WITHOUT CONTRAST  Technique:  Contiguous axial images were obtained from the base of the skull through the vertex without contrast.  Comparison: 10/27/2009  Findings: No acute intracranial abnormality.  Specifically, no hemorrhage, hydrocephalus, mass lesion, acute infarction, or significant intracranial injury.  No acute calvarial abnormality. Visualized paranasal sinuses and mastoids clear.  Orbital soft tissues unremarkable.  IMPRESSION: No acute intracranial abnormality.  Original Report Authenticated By: Cyndie Chime, M.D.   Mr Brain Wo  Contrast  09/22/2011  *RADIOLOGY REPORT*  Clinical Data: Hypertension for the 12 hours accompanied by headache and dizziness.  MRI HEAD WITHOUT CONTRAST  Technique:  Multiplanar, multiecho pulse sequences of the brain and surrounding structures were obtained according to standard protocol without intravenous contrast.  Comparison: 08/06/2013and07/21/2009 CT.  No comparison MR.  Mild paranasal sinus mucosal thickening/partial opacification.  The orbital structures unremarkable.  Findings: Motion degraded exam.  No acute infarct.  No findings of posterior reversible encephalopathy syndrome.  No intracranial hemorrhage.  No hydrocephalus.  No intracranial mass lesion detected on this unenhanced exam.  Cerebellar tonsils minimally low-lying but within the range normal limits.  Partial opacification/mucosal thickening paranasal sinuses.  Major intracranial vascular structures are patent.  Orbital structures unremarkable.  Slightly small pituitary gland.  IMPRESSION:  Motion degraded exam.  No acute infarct.  No findings of posterior reversible encephalopathy syndrome.  No intracranial hemorrhage.  No intracranial mass lesion detected on this unenhanced exam.  Cerebellar tonsils minimally low-lying but within the range normal limits.  Original Report Authenticated By: Fuller Canada, M.D.  1. Hypertension   2. Numbness   3. Chest pain       MDM  Patient was evaluated by myself. Based on patient presentation her hypertension light of her chest pain as well as symptoms of numbness was concerning. Patient took her home dose of clonidine here and blood pressure returned to within normal limits. Patient workup for her chest pain the previous evening given nonspecific T wave changes. Patient had negative troponins at 0 and 3 hours. Chest x-ray was unremarkable as were the remainder of the patient's labs. Patient also had CT of the head which was unremarkable. Patient had persistence of her numbness. An MRI was  performed with no evidence of progress or acute infarct. Patient remained hemodynamically stable. She was advised to continue her blood pressure medication which she had a prescription with a refill 4. Patient was told that she must take her medications and she must also followup with her primary care physician. List of possible resources were given. Patient was discharged in good condition. Patient had no objective neurologic findings today and had had symptoms for greater than 12 hours. Given her degree of symptoms as well as duration of them no further intervention was necessitated today. Patient was discharged in good condition. I suspect her symptoms were related to anxiety as well which was also the patient's own suspicion.        Cyndra Numbers, MD 09/23/11 [phone]

## 2012-04-19 ENCOUNTER — Encounter (HOSPITAL_BASED_OUTPATIENT_CLINIC_OR_DEPARTMENT_OTHER): Payer: Self-pay | Admitting: *Deleted

## 2012-04-19 ENCOUNTER — Emergency Department (HOSPITAL_BASED_OUTPATIENT_CLINIC_OR_DEPARTMENT_OTHER)
Admission: EM | Admit: 2012-04-19 | Discharge: 2012-04-19 | Disposition: A | Discharge status: Home / Self Care | Payer: Self-pay | Attending: Emergency Medicine | Admitting: Emergency Medicine

## 2012-04-19 ENCOUNTER — Emergency Department (HOSPITAL_BASED_OUTPATIENT_CLINIC_OR_DEPARTMENT_OTHER): Payer: Self-pay

## 2012-04-19 DIAGNOSIS — R109 Unspecified abdominal pain: Secondary | ICD-10-CM | POA: Insufficient documentation

## 2012-04-19 DIAGNOSIS — I10 Essential (primary) hypertension: Secondary | ICD-10-CM | POA: Insufficient documentation

## 2012-04-19 DIAGNOSIS — Z9889 Other specified postprocedural states: Secondary | ICD-10-CM | POA: Insufficient documentation

## 2012-04-19 DIAGNOSIS — Z79899 Other long term (current) drug therapy: Secondary | ICD-10-CM | POA: Insufficient documentation

## 2012-04-19 DIAGNOSIS — Z3202 Encounter for pregnancy test, result negative: Secondary | ICD-10-CM | POA: Insufficient documentation

## 2012-04-19 DIAGNOSIS — F411 Generalized anxiety disorder: Secondary | ICD-10-CM | POA: Insufficient documentation

## 2012-04-19 DIAGNOSIS — R110 Nausea: Secondary | ICD-10-CM | POA: Insufficient documentation

## 2012-04-19 DIAGNOSIS — F17200 Nicotine dependence, unspecified, uncomplicated: Secondary | ICD-10-CM | POA: Insufficient documentation

## 2012-04-19 LAB — COMPREHENSIVE METABOLIC PANEL
Albumin: 3.8 g/dL (ref 3.5–5.2)
Alkaline Phosphatase: 80 U/L (ref 39–117)
BUN: 12 mg/dL (ref 6–23)
CO2: 23 mEq/L (ref 19–32)
Chloride: 106 mEq/L (ref 96–112)
GFR calc non Af Amer: >90 mL/min (ref >90)
Potassium: 3.7 mEq/L (ref 3.5–5.1)
Total Bilirubin: 0.1 mg/dL — ABNORMAL LOW (ref 0.3–1.2)

## 2012-04-19 LAB — URINALYSIS, ROUTINE W REFLEX MICROSCOPIC
Bilirubin Urine: NEGATIVE
Glucose, UA: NEGATIVE mg/dL
Hgb urine dipstick: NEGATIVE
Ketones, ur: NEGATIVE mg/dL
Leukocytes, UA: NEGATIVE
Nitrite: NEGATIVE
Protein, ur: NEGATIVE mg/dL
Specific Gravity, Urine: 1.02 (ref 1.005–1.030)
Urobilinogen, UA: 0.2 mg/dL (ref 0.0–1.0)
pH: 6.5 (ref 5.0–8.0)

## 2012-04-19 LAB — CBC WITH DIFFERENTIAL/PLATELET
Basophils Absolute: 0 K/uL (ref 0.0–0.1)
Basophils Relative: 0 % (ref 0–1)
Eosinophils Absolute: 0.3 K/uL (ref 0.0–0.7)
Eosinophils Relative: 3 % (ref 0–5)
HCT: 33.4 % — ABNORMAL LOW (ref 36.0–46.0)
Hemoglobin: 12 g/dL (ref 12.0–15.0)
Lymphocytes Relative: 29 % (ref 12–46)
Lymphs Abs: 3.3 K/uL (ref 0.7–4.0)
MCH: 33.4 pg (ref 26.0–34.0)
MCHC: 35.9 g/dL (ref 30.0–36.0)
MCV: 93 fL (ref 78.0–100.0)
Monocytes Absolute: 0.7 K/uL (ref 0.1–1.0)
Monocytes Relative: 6 % (ref 3–12)
Neutro Abs: 7 K/uL (ref 1.7–7.7)
Neutrophils Relative %: 62 % (ref 43–77)
Platelets: 325 K/uL (ref 150–400)
RBC: 3.59 MIL/uL — ABNORMAL LOW (ref 3.87–5.11)
RDW: 11.1 % — ABNORMAL LOW (ref 11.5–15.5)
WBC: 11.3 K/uL — ABNORMAL HIGH (ref 4.0–10.5)

## 2012-04-19 LAB — LIPASE, BLOOD: Lipase: 23 U/L (ref 11–59)

## 2012-04-19 MED ORDER — HYDROCODONE-ACETAMINOPHEN 5-325 MG PO TABS: 2.0000 | ORAL_TABLET | ORAL | Status: DC | PRN

## 2012-04-19 NOTE — ED Notes (Signed)
Was sitting at the computer when she had sudden onset of abdominal pain across her mid abdomen into her diaphragm.

## 2012-04-19 NOTE — ED Provider Notes (Signed)
Medical screening examination/treatment/procedure(s) were performed by non-physician practitioner and as supervising physician I was immediately available for consultation/collaboration.   Gwyneth Sprout, MD 04/19/12 2237

## 2012-04-19 NOTE — ED Provider Notes (Signed)
History     CSN: [id number]  Arrival date & time 04/19/12  1931   First MD Initiated Contact with Patient 04/19/12 1941      Chief Complaint  Patient presents with  . Abdominal Pain    (Consider location/radiation/quality/duration/timing/severity/associated sxs/prior treatment) HPI Comments: Pt states that she had acute onset of abdominal pain radiating to the chest:pt states that she is still having epigastric soreness:pt denies history of similar symptoms   Patient is a 32 y.o. female presenting with abdominal pain. The history is provided by the patient. No language interpreter was used.  Abdominal Pain Pain location:  Epigastric Pain quality: aching and pressure   Pain radiates to:  RUQ Pain severity:  Moderate Onset quality:  Sudden Duration: 34 minutes. Progression:  Resolved Chronicity:  New Relieved by:  Nothing Worsened by:  Nothing tried Ineffective treatments:  None tried Associated symptoms: nausea   Associated symptoms: no cough, no diarrhea, no fever and no vomiting     Past Medical History  Diagnosis Date  . Hypertension   . Anxiety     Past Surgical History  Procedure Laterality Date  . Cesarean section      No family history on file.  History  Substance Use Topics  . Smoking status: Current Every Day Smoker -- 1.00 packs/day  . Smokeless tobacco: Never Used  . Alcohol Use: No     Comment: Occ    OB History   Grav Para Term Preterm Abortions TAB SAB Ect Mult Living                  Review of Systems  Constitutional: Negative for fever.  Respiratory: Negative for cough.   Cardiovascular: Negative.   Gastrointestinal: Positive for nausea and abdominal pain. Negative for vomiting and diarrhea.    Allergies  Review of patient's allergies indicates no known allergies.  Home Medications   Current Outpatient Rx  Name  Route  Sig  Dispense  Refill  . acetaminophen (TYLENOL) 500 MG tablet   Oral   Take 1,000 mg by mouth every 6 (six)  hours as needed. For headache.         Marland Kitchen LISINOPRIL PO   Oral   Take by mouth.         . lovastatin (MEVACOR) 20 MG tablet   Oral   Take 20 mg by mouth at bedtime.         . metoprolol tartrate (LOPRESSOR) 25 MG tablet   Oral   Take 25 mg by mouth 2 (two) times daily.         . cloNIDine (CATAPRES) 0.2 MG tablet   Oral   Take 0.2 mg by mouth 2 (two) times daily.         Marland Kitchen ibuprofen (ADVIL,MOTRIN) 200 MG tablet   Oral   Take 400 mg by mouth every 6 (six) hours as needed. Patient used this medication for her stomach pain.           BP 130/75  Pulse 69  Temp(Src) 98 F (36.7 C) (Oral)  Resp 22  Wt 132 lb (59.875 kg)  BMI 24.14 kg/m2  SpO2 100%  Physical Exam  Nursing note and vitals reviewed. Constitutional: She is oriented to person, place, and time. She appears well-developed and well-nourished.  HENT:  Head: Normocephalic and atraumatic.  Eyes: Conjunctivae and EOM are normal.  Neck: Neck supple.  Cardiovascular: Normal rate and regular rhythm.   Pulmonary/Chest: Effort normal and breath sounds normal.  Abdominal: Soft. Bowel sounds are normal. There is tenderness in the right upper quadrant and epigastric area.  Musculoskeletal: Normal range of motion.  Neurological: She is alert and oriented to person, place, and time.  Skin: Skin is warm and dry.  Psychiatric: She has a normal mood and affect.    ED Course  Procedures (including critical care time)  Labs Reviewed  CBC WITH DIFFERENTIAL - Abnormal; Notable for the following:    WBC 11.3 (*)    RBC 3.59 (*)    HCT 33.4 (*)    RDW 11.1 (*)    All other components within normal limits  COMPREHENSIVE METABOLIC PANEL - Abnormal; Notable for the following:    Total Bilirubin 0.1 (*)    All other components within normal limits  LIPASE, BLOOD  URINALYSIS, ROUTINE W REFLEX MICROSCOPIC  PREGNANCY, URINE   No results found.  Date: 04/19/2012  Rate: 70  Rhythm: normal sinus rhythm  QRS Axis:  normal  Intervals: normal  ST/T Wave abnormalities: normal  Conduction Disutrbances:none  Narrative Interpretation:   Old EKG Reviewed: unchanged    1. Abdominal pain       MDM  No acute abnormality noted:pt not in any pain at this time:will do symptomatic treatment and have follow up with pcp        Teressa Lower, NP 04/19/12 2126

## 2014-04-30 IMAGING — CT CT HEAD W/O CM
1 series · 16 of 30 positions shown, 20 images · non-contrast
Comparison: 10/27/2009

CLINICAL DATA: Hypertension, shortness of breath.

CT HEAD WITHOUT CONTRAST
TECHNIQUE: Contiguous axial images were obtained from the base of
the skull through the vertex without contrast.

[Series 2: head 4.8 h37s · axial · 0.45mm/px · z∈[-177,-25]mm · 16 of 36 slices shown, 20 images]
[im 2/36  brain]
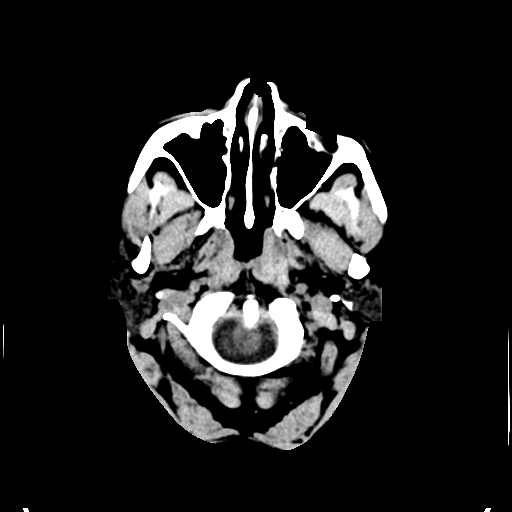
[im 2/36  bone]
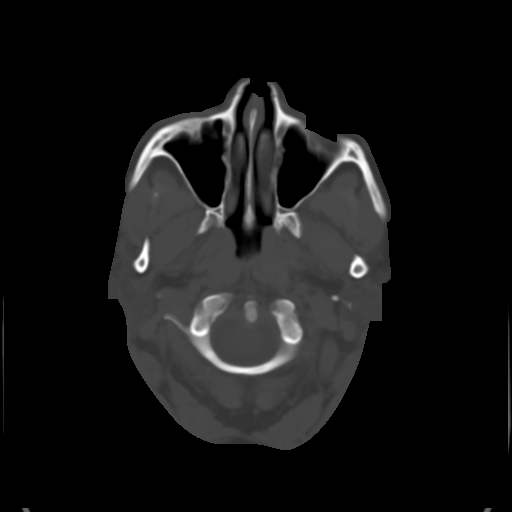
[im 4/36  brain]
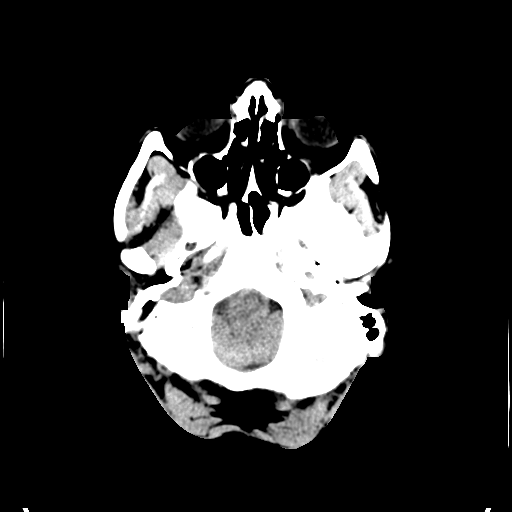
[im 7/36  brain]
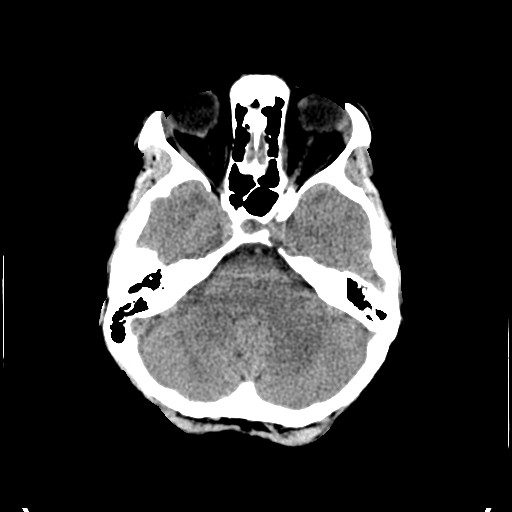
[im 9/36  brain]
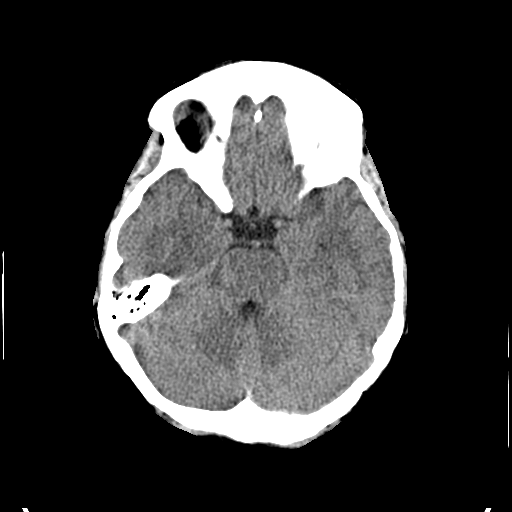
[im 10/36  brain]
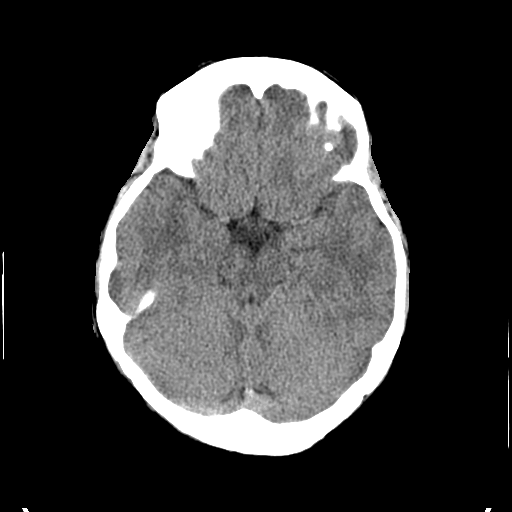
[im 10/36  bone]
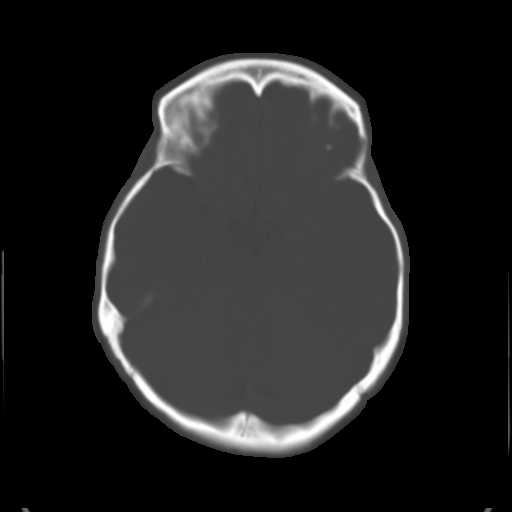
[im 13/36  brain]
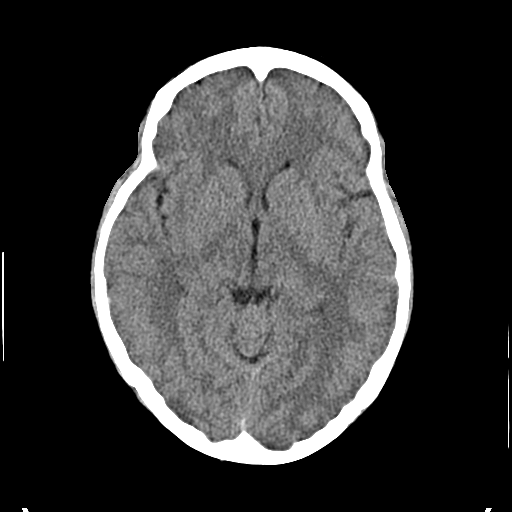
[im 15/36  brain]
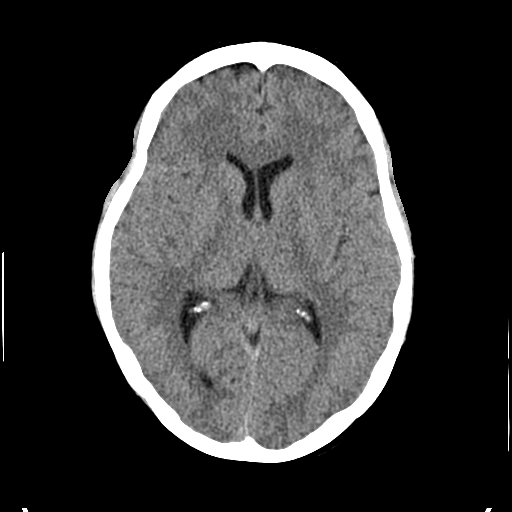
[im 17/36  brain]
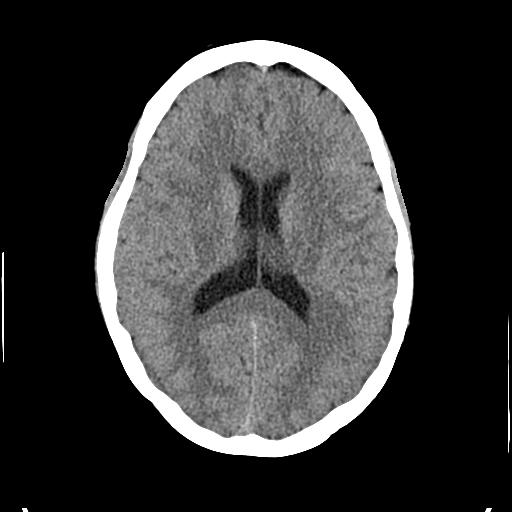
[im 19/36  brain]
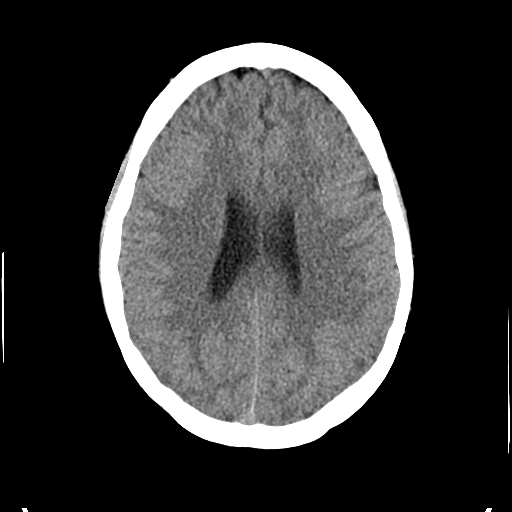
[im 19/36  bone]
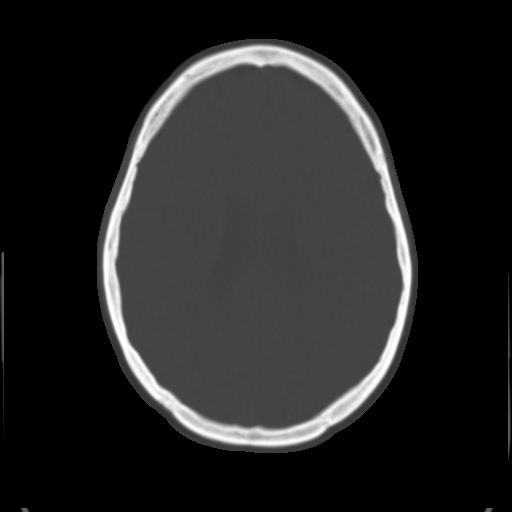
[im 21/36  brain]
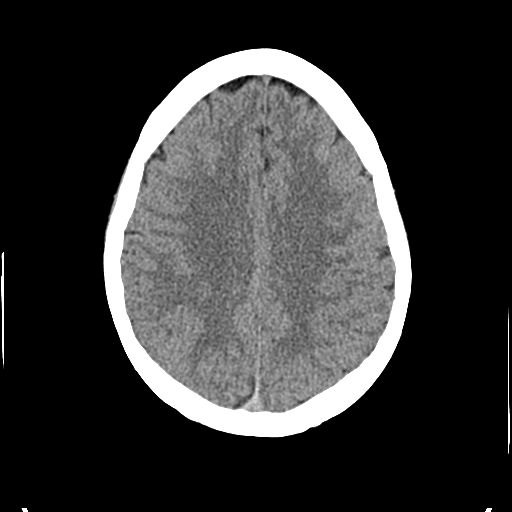
[im 23/36  brain]
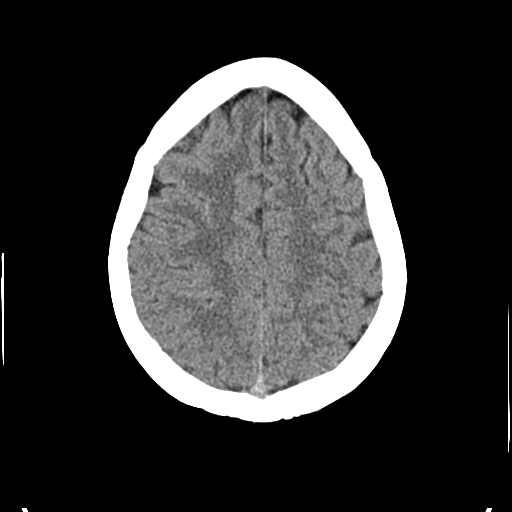
[im 26/36  brain]
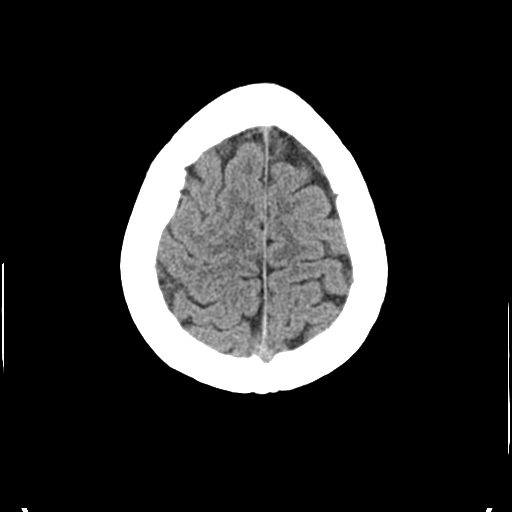
[im 27/36  brain]
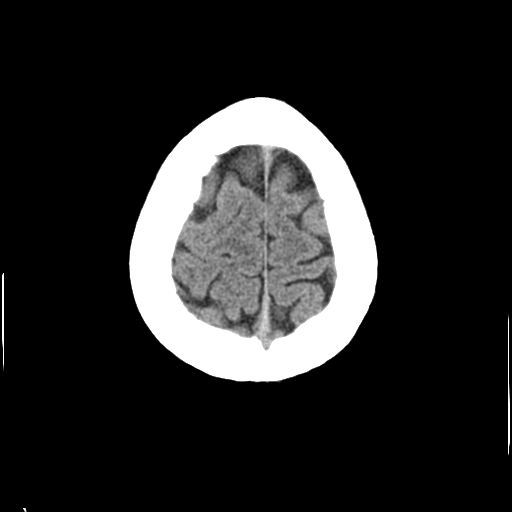
[im 27/36  bone]
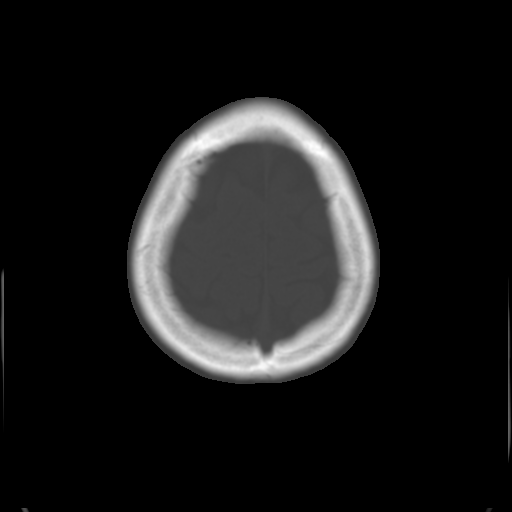
[im 29/36  brain]
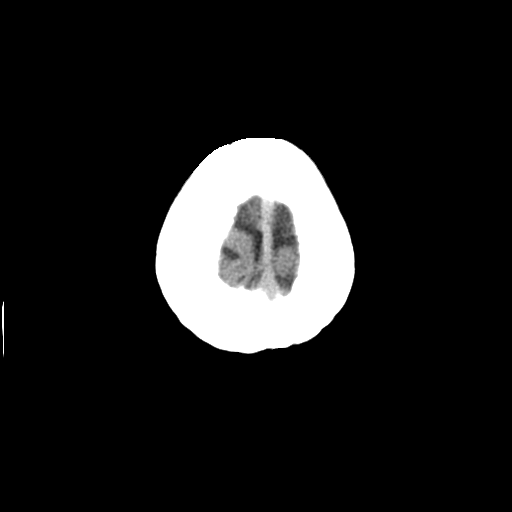
[im 32/36  brain]
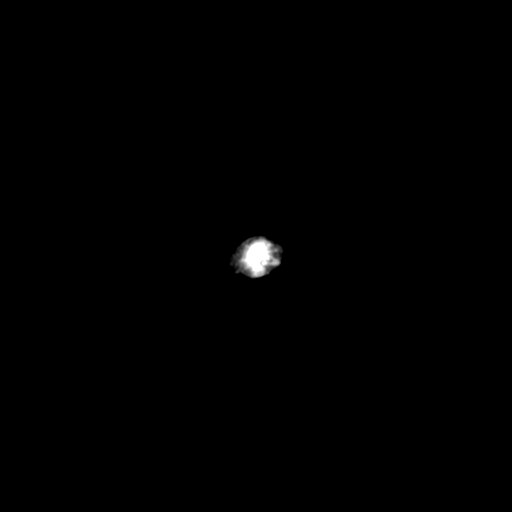
[im 34/36  brain]
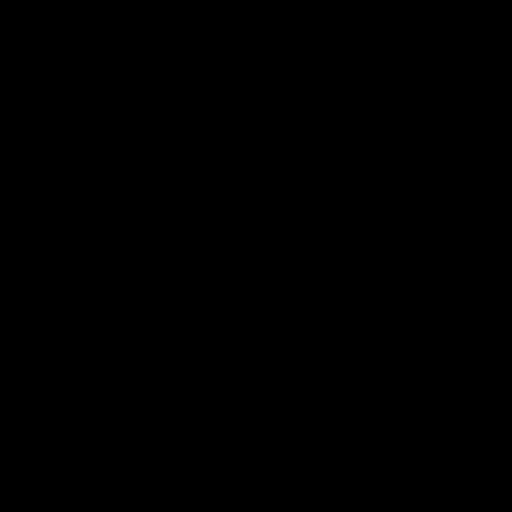

[16 of 30 positions shown; findings below may reference images not displayed]

FINDINGS: No acute intracranial abnormality.  Specifically, no
hemorrhage, hydrocephalus, mass lesion, acute infarction, or
significant intracranial injury.  No acute calvarial abnormality.
Visualized paranasal sinuses and mastoids clear.  Orbital soft
tissues unremarkable.
IMPRESSION: No acute intracranial abnormality.

## 2014-11-26 IMAGING — US US ABDOMEN COMPLETE
1 series · 14 of 25 positions shown · non-contrast
Comparison: Abdominal ultrasound 10/13/2005.

CLINICAL DATA: Abdominal pain.

COMPLETE ABDOMINAL ULTRASOUND

[Series 1: us abdomen complete · 0.32mm/px · 14 of 75 slices shown]
[im 1/75]
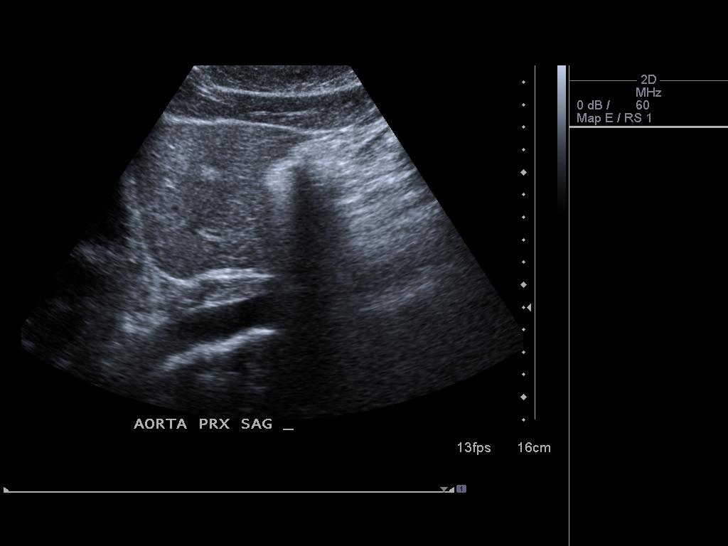
[im 7/75]
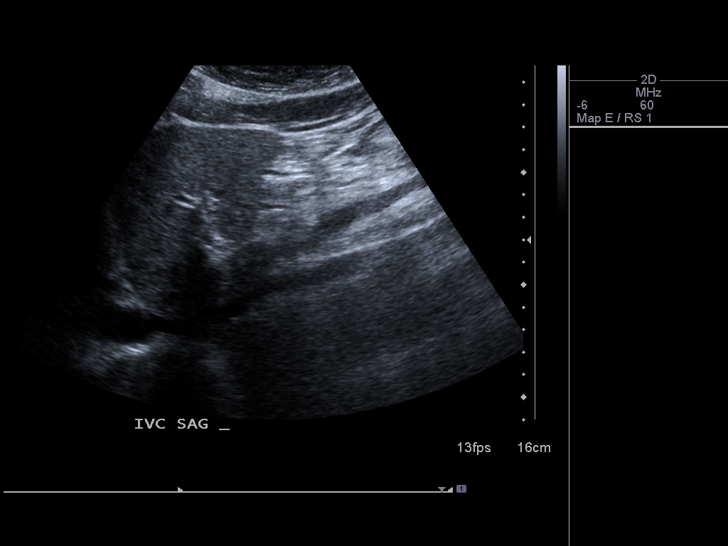
[im 13/75]
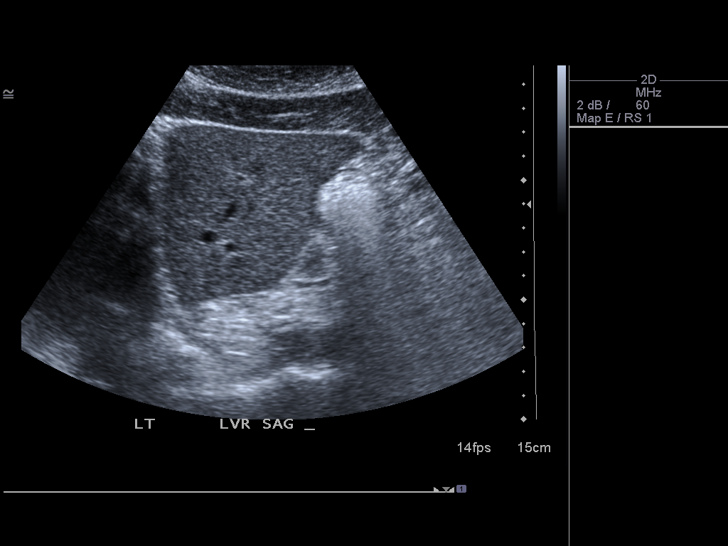
[im 19/75]
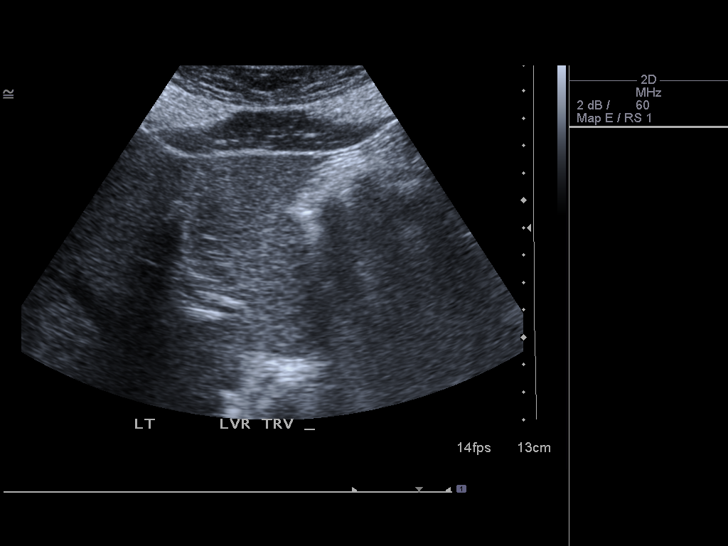
[im 25/75]
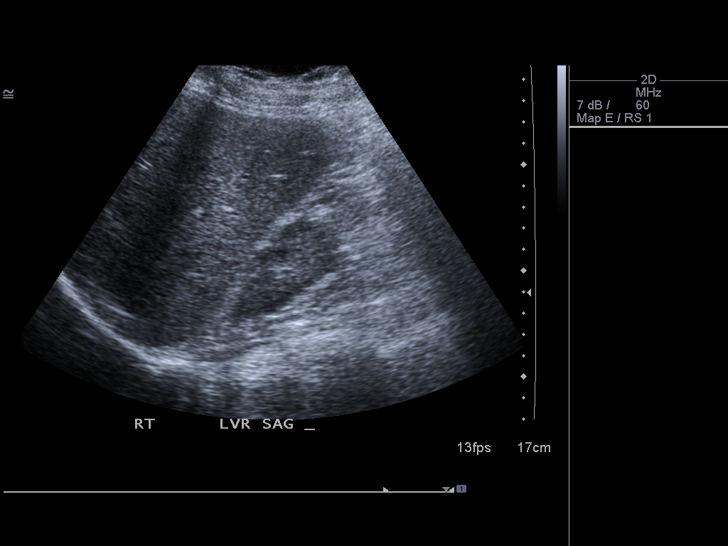
[im 28/75]
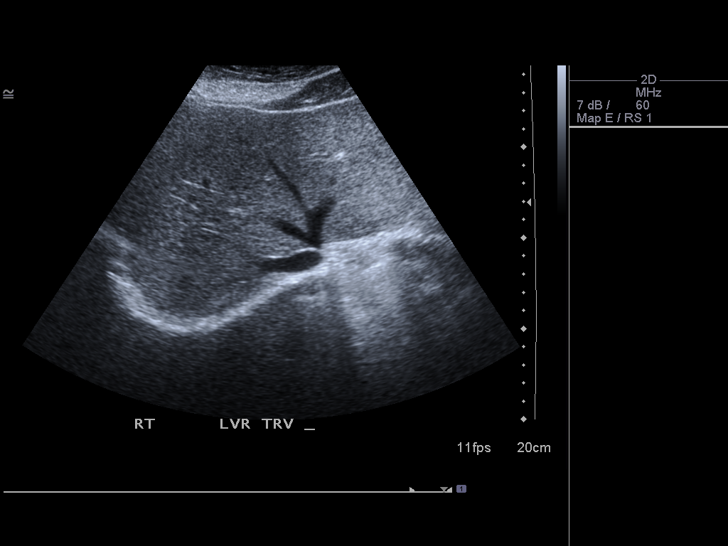
[im 34/75]
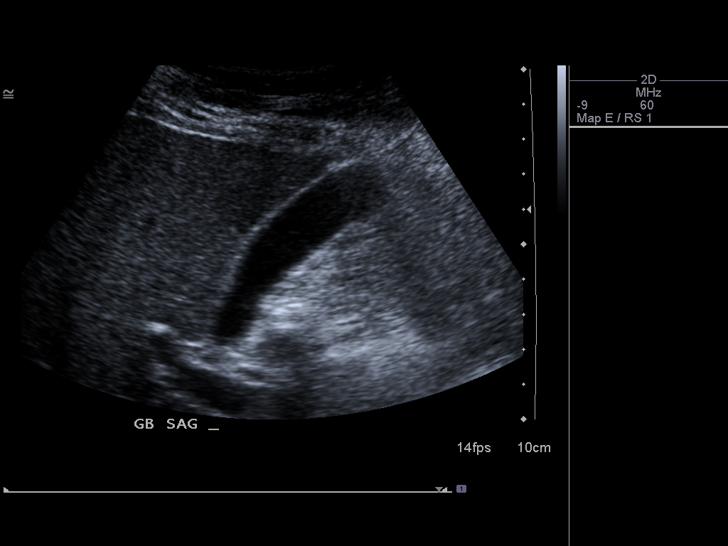
[im 41/75]
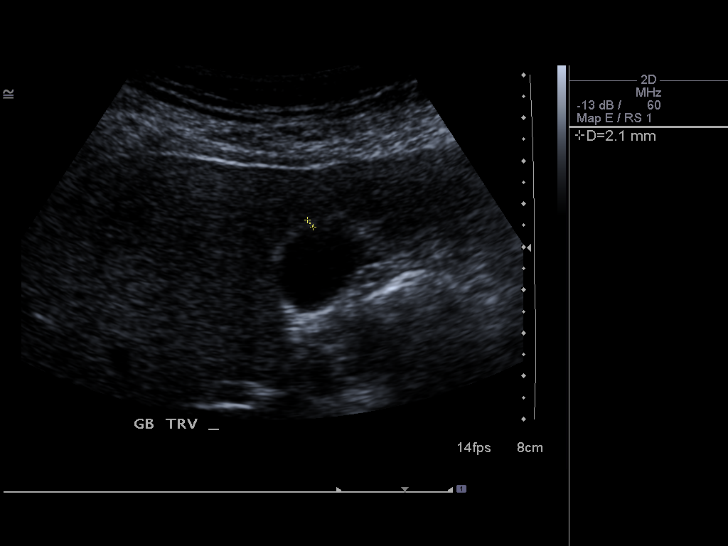
[im 47/75]
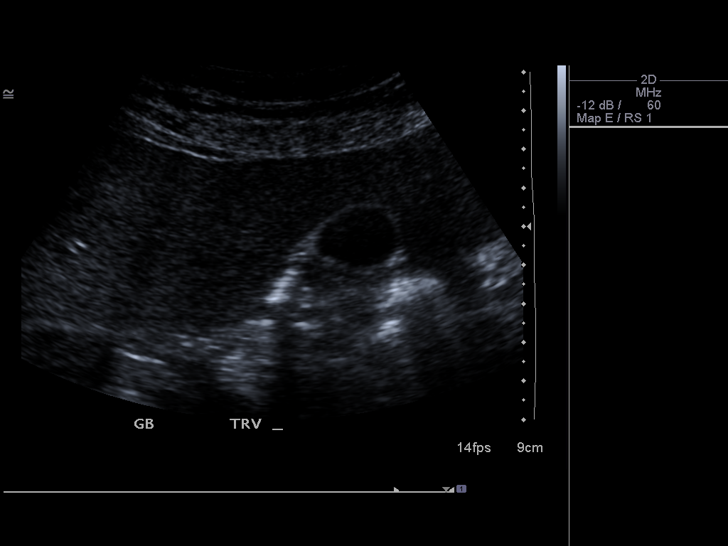
[im 50/75]
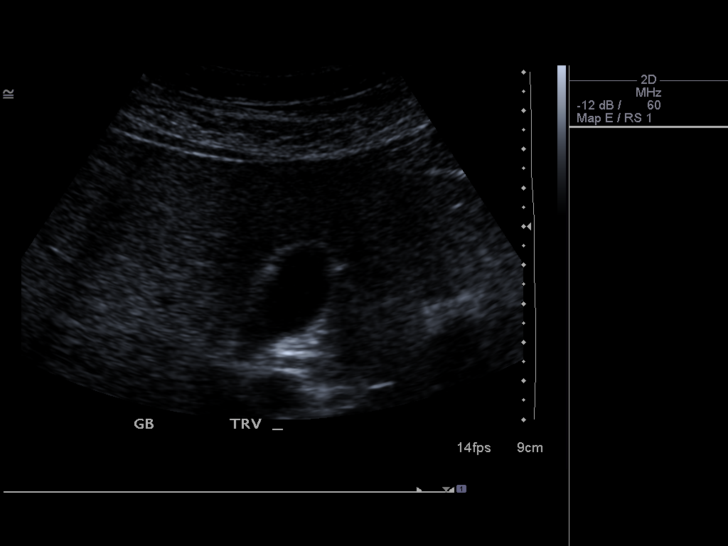
[im 56/75]
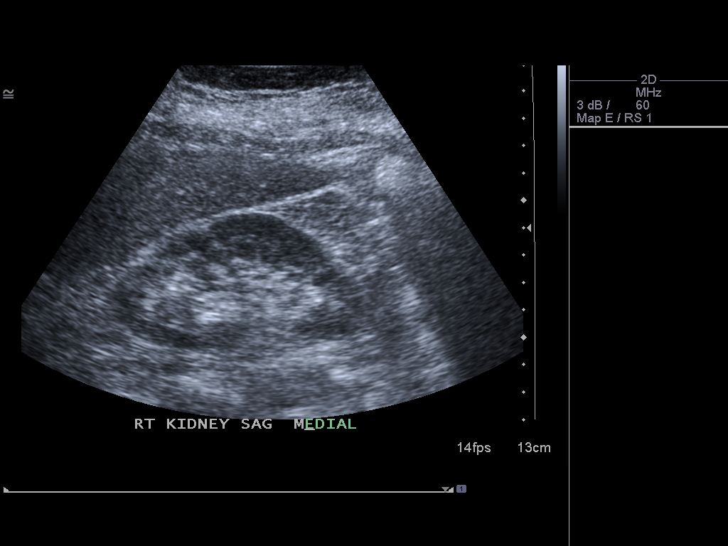
[im 62/75]
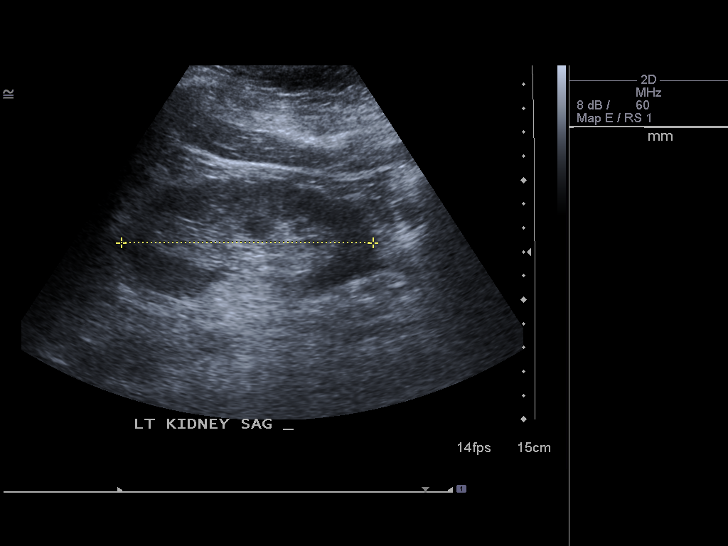
[im 68/75]
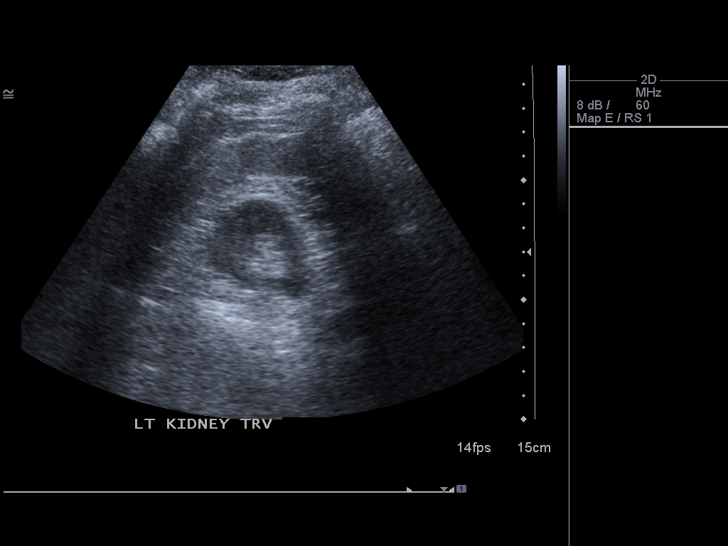
[im 75/75]
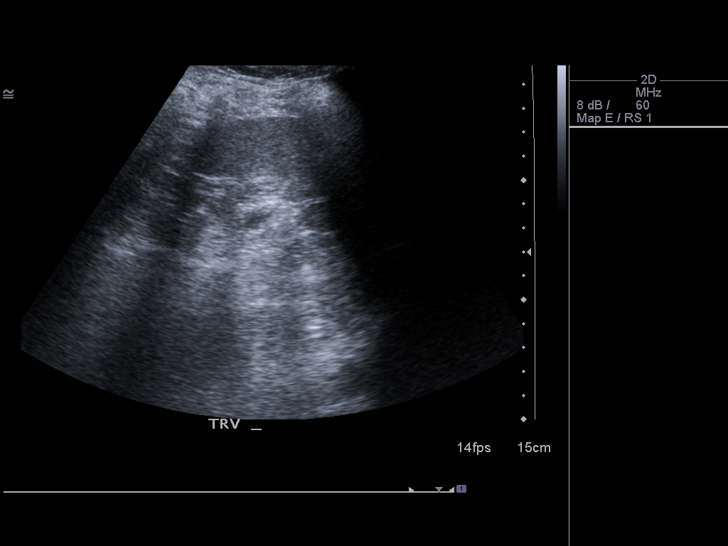

[14 of 25 positions shown; findings below may reference images not displayed]

FINDINGS: Gallbladder:  No gallstones, gallbladder wall thickening, or
pericholecystic fluid.

Common bile duct:  Measures 0.1 cm.

Liver:  No focal lesion identified.  Within normal limits in
parenchymal echogenicity.

IVC:  Appears normal.

Pancreas:  No focal abnormality seen.

Spleen:  Measures 5.6 cm and appears normal.

Right Kidney:  Measures 8.7 cm and appears normal.

Left Kidney:  Measures 10.5 cm and appears normal.

Abdominal aorta:  No aneurysm identified.
IMPRESSION: Negative abdominal ultrasound.

## 2015-07-30 ENCOUNTER — Telehealth: Payer: Self-pay | Admitting: Family Medicine

## 2015-07-30 DIAGNOSIS — J020 Streptococcal pharyngitis: Secondary | ICD-10-CM | POA: Insufficient documentation

## 2015-07-30 MED ORDER — AMOXICILLIN 500 MG PO CAPS: 500.0000 mg | ORAL_CAPSULE | Freq: Two times a day (BID) | ORAL | Status: DC

## 2015-07-30 NOTE — Telephone Encounter (Signed)
Seen with her son, Bernette Mayers, he is strep positive in clinic.  She has tender lymphadenopathy, sore throat, malaise. Considering there is strep pharyngitis in the house and covering her as well with amoxicillin.  Laroy Apple, MD [facility] 07/30/2015, 2:48 PM

## 2017-08-24 ENCOUNTER — Other Ambulatory Visit: Payer: Self-pay

## 2017-08-24 ENCOUNTER — Emergency Department (HOSPITAL_COMMUNITY)
Admission: EM | Admit: 2017-08-24 | Discharge: 2017-08-24 | Disposition: A | Discharge status: Home / Self Care | Payer: Self-pay | Attending: Emergency Medicine | Admitting: Emergency Medicine

## 2017-08-24 ENCOUNTER — Encounter (HOSPITAL_COMMUNITY): Payer: Self-pay | Admitting: Emergency Medicine

## 2017-08-24 DIAGNOSIS — Z5321 Procedure and treatment not carried out due to patient leaving prior to being seen by health care provider: Secondary | ICD-10-CM | POA: Insufficient documentation

## 2017-08-24 DIAGNOSIS — M79605 Pain in left leg: Secondary | ICD-10-CM | POA: Insufficient documentation

## 2017-08-24 DIAGNOSIS — M79672 Pain in left foot: Secondary | ICD-10-CM | POA: Insufficient documentation

## 2017-08-24 HISTORY — DX: Malignant neoplasm of unspecified ovary: C56.9

## 2017-08-24 NOTE — ED Triage Notes (Signed)
Patient complaining of left leg and left foot pain x 2 weeks. Denies injury. Also complaining of dizziness x 1 week.

## 2017-08-24 NOTE — ED Notes (Signed)
Pt called X1 with no answer.

## 2017-08-24 NOTE — ED Notes (Signed)
Pt called  X 3 with no answer

## 2017-08-25 NOTE — ED Notes (Addendum)
Follow up call made  Pt will return if needed  08/25/17  0911  s Rihanna Marseille rn

## 2019-11-28 ENCOUNTER — Ambulatory Visit: Payer: Self-pay | Admitting: Family Medicine

## 2019-12-07 ENCOUNTER — Ambulatory Visit: Payer: Self-pay | Admitting: Nurse Practitioner

## 2019-12-13 ENCOUNTER — Encounter: Payer: Self-pay | Admitting: General Practice

## 2023-08-18 ENCOUNTER — Encounter (HOSPITAL_COMMUNITY): Payer: Self-pay

## 2023-08-18 ENCOUNTER — Emergency Department (HOSPITAL_COMMUNITY): Payer: Self-pay

## 2023-08-18 ENCOUNTER — Emergency Department (HOSPITAL_COMMUNITY)
Admission: EM | Admit: 2023-08-18 | Discharge: 2023-08-19 | Disposition: A | Discharge status: Home / Self Care | Payer: Self-pay | Attending: Emergency Medicine | Admitting: Emergency Medicine

## 2023-08-18 ENCOUNTER — Other Ambulatory Visit: Payer: Self-pay

## 2023-08-18 DIAGNOSIS — R110 Nausea: Secondary | ICD-10-CM | POA: Diagnosis present

## 2023-08-18 DIAGNOSIS — R079 Chest pain, unspecified: Secondary | ICD-10-CM | POA: Diagnosis not present

## 2023-08-18 DIAGNOSIS — R42 Dizziness and giddiness: Secondary | ICD-10-CM | POA: Insufficient documentation

## 2023-08-18 DIAGNOSIS — F1910 Other psychoactive substance abuse, uncomplicated: Secondary | ICD-10-CM | POA: Diagnosis not present

## 2023-08-18 DIAGNOSIS — R0602 Shortness of breath: Secondary | ICD-10-CM | POA: Insufficient documentation

## 2023-08-18 LAB — CBC WITH DIFFERENTIAL/PLATELET
Abs Immature Granulocytes: 0.08 10*3/uL — ABNORMAL HIGH (ref 0.00–0.07)
Basophils Absolute: 0.1 10*3/uL (ref 0.0–0.1)
Basophils Relative: 0 %
Eosinophils Absolute: 0.1 10*3/uL (ref 0.0–0.5)
Eosinophils Relative: 0 %
HCT: 41 % (ref 36.0–46.0)
Hemoglobin: 14.5 g/dL (ref 12.0–15.0)
Immature Granulocytes: 0 %
Lymphocytes Relative: 12 %
Lymphs Abs: 2.4 10*3/uL (ref 0.7–4.0)
MCH: 32.4 pg (ref 26.0–34.0)
MCHC: 35.4 g/dL (ref 30.0–36.0)
MCV: 91.5 fL (ref 80.0–100.0)
Monocytes Absolute: 1.4 10*3/uL — ABNORMAL HIGH (ref 0.1–1.0)
Monocytes Relative: 7 %
Neutro Abs: 16.4 10*3/uL — ABNORMAL HIGH (ref 1.7–7.7)
Neutrophils Relative %: 81 %
Platelets: 393 10*3/uL (ref 150–400)
RBC: 4.48 MIL/uL (ref 3.87–5.11)
RDW: 11.7 % (ref 11.5–15.5)
WBC: 20.4 10*3/uL — ABNORMAL HIGH (ref 4.0–10.5)
nRBC: 0 % (ref 0.0–0.2)

## 2023-08-18 LAB — RAPID URINE DRUG SCREEN, HOSP PERFORMED
Amphetamines: POSITIVE — AB
Barbiturates: NOT DETECTED
Benzodiazepines: NOT DETECTED
Cocaine: NOT DETECTED
Opiates: NOT DETECTED
Tetrahydrocannabinol: NOT DETECTED

## 2023-08-18 LAB — COMPREHENSIVE METABOLIC PANEL WITH GFR
ALT: 15 U/L (ref 0–44)
AST: 22 U/L (ref 15–41)
Albumin: 4.7 g/dL (ref 3.5–5.0)
Alkaline Phosphatase: 106 U/L (ref 38–126)
Anion gap: 14 (ref 5–15)
BUN: 11 mg/dL (ref 6–20)
CO2: 24 mmol/L (ref 22–32)
Calcium: 9.3 mg/dL (ref 8.9–10.3)
Chloride: 97 mmol/L — ABNORMAL LOW (ref 98–111)
Creatinine, Ser: 0.9 mg/dL (ref 0.44–1.00)
GFR, Estimated: >60 mL/min (ref >60)
Glucose, Bld: 106 mg/dL — ABNORMAL HIGH (ref 70–99)
Potassium: 2.5 mmol/L — CL (ref 3.5–5.1)
Sodium: 135 mmol/L (ref 135–145)
Total Bilirubin: 0.4 mg/dL (ref 0.0–1.2)
Total Protein: 8.9 g/dL — ABNORMAL HIGH (ref 6.5–8.1)

## 2023-08-18 LAB — ETHANOL: Alcohol, Ethyl (B): <15 mg/dL (ref <15)

## 2023-08-18 LAB — URINALYSIS, ROUTINE W REFLEX MICROSCOPIC
Bacteria, UA: NONE SEEN
Bilirubin Urine: NEGATIVE
Glucose, UA: NEGATIVE mg/dL
Ketones, ur: NEGATIVE mg/dL
Leukocytes,Ua: NEGATIVE
Nitrite: NEGATIVE
Protein, ur: NEGATIVE mg/dL
Specific Gravity, Urine: 1.001 — ABNORMAL LOW (ref 1.005–1.030)
pH: 6 (ref 5.0–8.0)

## 2023-08-18 LAB — HCG, SERUM, QUALITATIVE: Preg, Serum: NEGATIVE

## 2023-08-18 LAB — TROPONIN I (HIGH SENSITIVITY): Troponin I (High Sensitivity): 7 ng/L (ref <18)

## 2023-08-18 MED ORDER — METRONIDAZOLE 500 MG/100ML IV SOLN
500.0000 mg | Freq: Once | INTRAVENOUS | Status: AC
  Administered 2023-08-19: 500 mg via INTRAVENOUS
  Filled 2023-08-18: qty 100

## 2023-08-18 MED ORDER — VANCOMYCIN HCL IN DEXTROSE 1-5 GM/200ML-% IV SOLN
1000.0000 mg | Freq: Once | INTRAVENOUS | Status: AC
  Administered 2023-08-19: 1000 mg via INTRAVENOUS
  Filled 2023-08-18: qty 200

## 2023-08-18 MED ORDER — LACTATED RINGERS IV BOLUS (SEPSIS)
1000.0000 mL | Freq: Once | INTRAVENOUS | Status: AC
  Administered 2023-08-19: 1000 mL via INTRAVENOUS

## 2023-08-18 MED ORDER — POTASSIUM CHLORIDE 10 MEQ/100ML IV SOLN
10.0000 meq | INTRAVENOUS | Status: AC
  Administered 2023-08-19 (×4): 10 meq via INTRAVENOUS
  Filled 2023-08-18 (×4): qty 100

## 2023-08-18 MED ORDER — LACTATED RINGERS IV SOLN: INTRAVENOUS | Status: DC

## 2023-08-18 MED ORDER — SODIUM CHLORIDE 0.9 % IV BOLUS
1000.0000 mL | Freq: Once | INTRAVENOUS | Status: AC
  Administered 2023-08-18: 1000 mL via INTRAVENOUS

## 2023-08-18 MED ORDER — SODIUM CHLORIDE 0.9 % IV SOLN
2.0000 g | Freq: Once | INTRAVENOUS | Status: AC
  Administered 2023-08-18: 2 g via INTRAVENOUS
  Filled 2023-08-18: qty 12.5

## 2023-08-18 NOTE — ED Triage Notes (Signed)
 [place] after [place] use x2hrs ago. Endorses nausea, sob,dizziness, and CP. Pt very fidgety and pacing during triage.  HR:123 BP:168/100 O2:99% RA

## 2023-08-18 NOTE — ED Provider Notes (Signed)
 [facility] Emergency Department Provider Note MRN:  [id number]  Arrival date & time: 08/19/23     Chief Complaint   Drug Problem   History of Present Illness   Sarah [name] is a 43 y.o. year-old female presents to the ED with chief complaint of nausea, SOB, dizziness, and CP after meth use 2 hours.  BIB EMS from a gas station.  States that she may have used other drugs, but isn't sure what she took.  She states that she feels anxious. She denies recent illness.  Sarah [name]  History provided by patient.   Review of Systems  Pertinent positive and negative review of systems noted in HPI.    Physical Exam   Vitals:   08/19/23 0315 08/19/23 0515  BP: (!) 128/98 (!) 143/98  Pulse: (!) 107 87  Resp: 14 16  Temp: 98.5 F (36.9 C)   SpO2: 100% 100%    CONSTITUTIONAL:  anxious-appearing, NAD NEURO:  Alert and oriented x 3, CN 3-12 grossly intact EYES:  eyes equal and reactive ENT/NECK:  Supple, no stridor  CARDIO:  tachycardic, regular rhythm, appears well-perfused  PULM:  No respiratory distress, CTAB GI/GU:  non-distended,  MSK/SPINE:  No gross deformities, no edema, moves all extremities  SKIN:  no rash, atraumatic   *Additional and/or pertinent findings included in MDM below  Diagnostic and Interventional Summary    EKG Interpretation Date/Time:    Ventricular Rate:    PR Interval:    QRS Duration:    QT Interval:    QTC Calculation:   R Axis:      Text Interpretation:         Labs Reviewed  CBC WITH DIFFERENTIAL/PLATELET - Abnormal; Notable for the following components:      Result Value   WBC 20.4 (*)    Neutro Abs 16.4 (*)    Monocytes Absolute 1.4 (*)    Abs Immature Granulocytes 0.08 (*)    All other components within normal limits  COMPREHENSIVE METABOLIC PANEL WITH GFR - Abnormal; Notable for the following components:   Potassium 2.5 (*)    Chloride 97 (*)    Glucose, Bld 106 (*)    Total Protein 8.9 (*)    All other  components within normal limits  URINALYSIS, ROUTINE W REFLEX MICROSCOPIC - Abnormal; Notable for the following components:   Color, Urine COLORLESS (*)    Specific Gravity, Urine 1.001 (*)    Hgb urine dipstick MODERATE (*)    All other components within normal limits  RAPID URINE DRUG SCREEN, HOSP PERFORMED - Abnormal; Notable for the following components:   Amphetamines POSITIVE (*)    All other components within normal limits  RESP PANEL BY RT-PCR (RSV, FLU A&B, COVID)  RVPGX2  CULTURE, BLOOD (ROUTINE X 2)  CULTURE, BLOOD (ROUTINE X 2)  ETHANOL  HCG, SERUM, QUALITATIVE  PROTIME-INR  URINALYSIS, W/ REFLEX TO CULTURE (INFECTION SUSPECTED)  I-STAT CG4 LACTIC ACID, ED  I-STAT CG4 LACTIC ACID, ED  TROPONIN I (HIGH SENSITIVITY)  TROPONIN I (HIGH SENSITIVITY)    CT Angio Chest PE W and/or Wo Contrast  Final Result    DG Chest Port 1 View  Final Result      Medications  lactated ringers infusion (0 mLs Intravenous Stopped 08/19/23 0559)  sodium chloride  0.9 % bolus 1,000 mL (0 mLs Intravenous Stopped 08/19/23 0019)  potassium chloride 10 mEq in 100 mL IVPB (0 mEq Intravenous Stopped 08/19/23 0559)  ceFEPIme (MAXIPIME) 2 g in  sodium chloride  0.9 % 100 mL IVPB (0 g Intravenous Stopped 08/19/23 0042)  metroNIDAZOLE (FLAGYL) IVPB 500 mg (0 mg Intravenous Stopped 08/19/23 0213)  vancomycin (VANCOCIN) IVPB 1000 mg/200 mL premix (0 mg Intravenous Stopped 08/19/23 0317)  lactated ringers bolus 1,000 mL (0 mLs Intravenous Stopped 08/19/23 0213)  iohexol (OMNIPAQUE) 350 MG/ML injection 75 mL (75 mLs Intravenous Contrast Given 08/19/23 0106)     Procedures  /  Critical Care Procedures  ED Course and Medical Decision Making  I have reviewed the triage vital signs, the nursing notes, and pertinent available records from the EMR.  Social Determinants Affecting Complexity of Care: Patient has no clinically significant social determinants affecting this chief complaint..   ED Course:    Medical  Decision Making Patient here feeling anxious after reportedly using meth at a gas station parking lot.  Brought in by EMS.  She is initially noted to be tachycardic.  She does not appear toxic.  Labs are notable for a leukocytosis to 20, no specific cause.  Of uncertain significance.    Normal lactic, RVP is normal.  Urinalysis is not suggestive of infection.  UDS is positive for amphetamines.  Potassium initially low at 2.5, this was repleted with IV potassium.  Patient's heart rate normalized throughout the night.  I did get a CT of her chest to rule out PE given that she had tachycardia and had complained of some chest tightness.  There is no wheezing on my exam.  PE study was negative.  I doubt ACS.  Her troponins are flat negative.  Feel the patient is stable for discharge and outpatient follow-up.  Amount and/or Complexity of Data Reviewed Labs: ordered. Radiology: ordered.  Risk Prescription drug management.         Consultants: No consultations were needed in caring for this patient.   Treatment and Plan: I considered admission due to patient's initial presentation, but after considering the examination and diagnostic results, patient will not require admission and can be discharged with outpatient follow-up.    Final Clinical Impressions(s) / ED Diagnoses     ICD-10-CM   1. Polysubstance abuse (HCC)  F19.10       ED Discharge Orders     None         Discharge Instructions Discussed with and Provided to Patient:   Discharge Instructions   None      Vicky Charleston, PA-C 08/19/23 0601    Bari Charmaine FALCON, MD 08/19/23 [phone]

## 2023-08-19 LAB — PROTIME-INR
INR: 1 (ref 0.8–1.2)
Prothrombin Time: 13.2 s (ref 11.4–15.2)

## 2023-08-19 LAB — RESP PANEL BY RT-PCR (RSV, FLU A&B, COVID)  RVPGX2
Influenza A by PCR: NEGATIVE
Influenza B by PCR: NEGATIVE
Resp Syncytial Virus by PCR: NEGATIVE
SARS Coronavirus 2 by RT PCR: NEGATIVE

## 2023-08-19 LAB — I-STAT CG4 LACTIC ACID, ED: Lactic Acid, Venous: 0.8 mmol/L (ref 0.5–1.9)

## 2023-08-19 LAB — TROPONIN I (HIGH SENSITIVITY): Troponin I (High Sensitivity): 11 ng/L (ref <18)

## 2023-08-19 MED ORDER — SODIUM CHLORIDE (PF) 0.9 % IJ SOLN
INTRAMUSCULAR | Status: AC
  Filled 2023-08-19: qty 50

## 2023-08-19 MED ORDER — IOHEXOL 350 MG/ML SOLN
75.0000 mL | Freq: Once | INTRAVENOUS | Status: AC | PRN
  Administered 2023-08-19: 75 mL via INTRAVENOUS

## 2023-08-19 NOTE — Sepsis Progress Note (Signed)
 Following for sepsis monitoring ?

## 2023-08-24 LAB — CULTURE, BLOOD (ROUTINE X 2)
Culture: NO GROWTH
Culture: NO GROWTH
Special Requests: ADEQUATE

## 2023-09-08 ENCOUNTER — Encounter (HOSPITAL_COMMUNITY): Payer: Self-pay | Admitting: Emergency Medicine

## 2023-09-08 ENCOUNTER — Emergency Department (HOSPITAL_COMMUNITY)
Admission: EM | Admit: 2023-09-08 | Discharge: 2023-09-09 | Disposition: A | Discharge status: Home / Self Care | Payer: Self-pay | Attending: Emergency Medicine | Admitting: Emergency Medicine

## 2023-09-08 ENCOUNTER — Observation Stay (HOSPITAL_COMMUNITY)
Admission: EM | Admit: 2023-09-08 | Discharge: 2023-09-08 | Disposition: A | Discharge status: Home / Self Care | Payer: Self-pay | Attending: Family Medicine | Admitting: Family Medicine

## 2023-09-08 ENCOUNTER — Emergency Department (HOSPITAL_COMMUNITY): Payer: Self-pay

## 2023-09-08 ENCOUNTER — Observation Stay (HOSPITAL_BASED_OUTPATIENT_CLINIC_OR_DEPARTMENT_OTHER): Payer: Self-pay

## 2023-09-08 ENCOUNTER — Other Ambulatory Visit (HOSPITAL_COMMUNITY): Payer: Self-pay | Admitting: *Deleted

## 2023-09-08 ENCOUNTER — Other Ambulatory Visit: Payer: Self-pay

## 2023-09-08 DIAGNOSIS — I214 Non-ST elevation (NSTEMI) myocardial infarction: Secondary | ICD-10-CM | POA: Diagnosis not present

## 2023-09-08 DIAGNOSIS — F17210 Nicotine dependence, cigarettes, uncomplicated: Secondary | ICD-10-CM | POA: Diagnosis not present

## 2023-09-08 DIAGNOSIS — E876 Hypokalemia: Secondary | ICD-10-CM | POA: Diagnosis not present

## 2023-09-08 DIAGNOSIS — Z7982 Long term (current) use of aspirin: Secondary | ICD-10-CM | POA: Diagnosis not present

## 2023-09-08 DIAGNOSIS — F410 Panic disorder [episodic paroxysmal anxiety] without agoraphobia: Secondary | ICD-10-CM | POA: Diagnosis present

## 2023-09-08 DIAGNOSIS — F1510 Other stimulant abuse, uncomplicated: Secondary | ICD-10-CM | POA: Diagnosis not present

## 2023-09-08 DIAGNOSIS — I10 Essential (primary) hypertension: Secondary | ICD-10-CM | POA: Diagnosis not present

## 2023-09-08 DIAGNOSIS — Z8543 Personal history of malignant neoplasm of ovary: Secondary | ICD-10-CM | POA: Diagnosis not present

## 2023-09-08 DIAGNOSIS — R079 Chest pain, unspecified: Secondary | ICD-10-CM | POA: Diagnosis present

## 2023-09-08 DIAGNOSIS — Z79899 Other long term (current) drug therapy: Secondary | ICD-10-CM | POA: Diagnosis not present

## 2023-09-08 DIAGNOSIS — R825 Elevated urine levels of drugs, medicaments and biological substances: Secondary | ICD-10-CM | POA: Diagnosis not present

## 2023-09-08 DIAGNOSIS — R0789 Other chest pain: Principal | ICD-10-CM | POA: Insufficient documentation

## 2023-09-08 DIAGNOSIS — I259 Chronic ischemic heart disease, unspecified: Secondary | ICD-10-CM

## 2023-09-08 DIAGNOSIS — F419 Anxiety disorder, unspecified: Secondary | ICD-10-CM | POA: Insufficient documentation

## 2023-09-08 LAB — CBC
HCT: 37.1 % (ref 36.0–46.0)
Hemoglobin: 12.7 g/dL (ref 12.0–15.0)
MCH: 31.9 pg (ref 26.0–34.0)
MCHC: 34.2 g/dL (ref 30.0–36.0)
MCV: 93.2 fL (ref 80.0–100.0)
Platelets: 329 K/uL (ref 150–400)
RBC: 3.98 MIL/uL (ref 3.87–5.11)
RDW: 11.8 % (ref 11.5–15.5)
WBC: 7.7 K/uL (ref 4.0–10.5)
nRBC: 0 % (ref 0.0–0.2)

## 2023-09-08 LAB — MAGNESIUM: Magnesium: 1.9 mg/dL (ref 1.7–2.4)

## 2023-09-08 LAB — ECHOCARDIOGRAM COMPLETE
Area-P 1/2: 3.21 cm2
Height: 61 in
S' Lateral: 3.3 cm
Weight: 2000 [oz_av]

## 2023-09-08 LAB — BASIC METABOLIC PANEL WITH GFR
Anion gap: 11 (ref 5–15)
BUN: 10 mg/dL (ref 6–20)
CO2: 26 mmol/L (ref 22–32)
Calcium: 8.5 mg/dL — ABNORMAL LOW (ref 8.9–10.3)
Chloride: 102 mmol/L (ref 98–111)
Creatinine, Ser: 0.65 mg/dL (ref 0.44–1.00)
GFR, Estimated: >60 mL/min (ref >60)
Glucose, Bld: 90 mg/dL (ref 70–99)
Potassium: 2.8 mmol/L — ABNORMAL LOW (ref 3.5–5.1)
Sodium: 139 mmol/L (ref 135–145)

## 2023-09-08 LAB — TROPONIN I (HIGH SENSITIVITY)
Troponin I (High Sensitivity): 87 ng/L — ABNORMAL HIGH (ref <18)
Troponin I (High Sensitivity): 77 ng/L — ABNORMAL HIGH (ref <18)

## 2023-09-08 LAB — HCG, SERUM, QUALITATIVE: Preg, Serum: NEGATIVE

## 2023-09-08 LAB — POTASSIUM: Potassium: 4.3 mmol/L (ref 3.5–5.1)

## 2023-09-08 MED ORDER — ONDANSETRON HCL 4 MG/2ML IJ SOLN
4.0000 mg | Freq: Four times a day (QID) | INTRAMUSCULAR | Status: DC | PRN
Start: 2023-09-08 — End: 2023-09-08

## 2023-09-08 MED ORDER — ASPIRIN 81 MG PO CHEW: 81.0000 mg | CHEWABLE_TABLET | Freq: Every day | ORAL | 0 refills | Status: AC

## 2023-09-08 MED ORDER — OXYCODONE HCL 5 MG PO TABS: 5.0000 mg | ORAL_TABLET | ORAL | Status: DC | PRN

## 2023-09-08 MED ORDER — POTASSIUM CHLORIDE CRYS ER 20 MEQ PO TBCR
40.0000 meq | EXTENDED_RELEASE_TABLET | Freq: Once | ORAL | Status: AC
  Administered 2023-09-08: 40 meq via ORAL
  Filled 2023-09-08: qty 2

## 2023-09-08 MED ORDER — ACETAMINOPHEN 325 MG PO TABS: 650.0000 mg | ORAL_TABLET | Freq: Four times a day (QID) | ORAL | Status: DC | PRN

## 2023-09-08 MED ORDER — POTASSIUM CHLORIDE 10 MEQ/100ML IV SOLN
10.0000 meq | INTRAVENOUS | Status: DC
  Administered 2023-09-08 (×4): 10 meq via INTRAVENOUS
  Filled 2023-09-08 (×5): qty 100

## 2023-09-08 MED ORDER — ASPIRIN 81 MG PO CHEW
324.0000 mg | CHEWABLE_TABLET | Freq: Once | ORAL | Status: AC
  Administered 2023-09-08: 324 mg via ORAL
  Filled 2023-09-08: qty 4

## 2023-09-08 MED ORDER — ACETAMINOPHEN 650 MG RE SUPP: 650.0000 mg | Freq: Four times a day (QID) | RECTAL | Status: DC | PRN

## 2023-09-08 MED ORDER — ASPIRIN 81 MG PO CHEW: 81.0000 mg | CHEWABLE_TABLET | Freq: Every day | ORAL | Status: DC

## 2023-09-08 MED ORDER — POTASSIUM CHLORIDE 10 MEQ/50ML IV SOLN: 10.0000 meq | INTRAVENOUS | Status: DC

## 2023-09-08 MED ORDER — ENOXAPARIN SODIUM 40 MG/0.4ML IJ SOSY
40.0000 mg | PREFILLED_SYRINGE | INTRAMUSCULAR | Status: DC
  Administered 2023-09-08: 40 mg via SUBCUTANEOUS
  Filled 2023-09-08: qty 0.4

## 2023-09-08 MED ORDER — PRAVASTATIN SODIUM 10 MG PO TABS: 20.0000 mg | ORAL_TABLET | Freq: Every day | ORAL | Status: DC

## 2023-09-08 MED ORDER — METOPROLOL TARTRATE 25 MG PO TABS: 25.0000 mg | ORAL_TABLET | Freq: Two times a day (BID) | ORAL | 1 refills | Status: AC

## 2023-09-08 MED ORDER — ONDANSETRON HCL 4 MG PO TABS: 4.0000 mg | ORAL_TABLET | Freq: Four times a day (QID) | ORAL | Status: DC | PRN

## 2023-09-08 MED ORDER — PANTOPRAZOLE SODIUM 40 MG PO TBEC
40.0000 mg | DELAYED_RELEASE_TABLET | Freq: Two times a day (BID) | ORAL | Status: DC
  Administered 2023-09-08: 40 mg via ORAL
  Filled 2023-09-08: qty 1

## 2023-09-08 MED ORDER — CARVEDILOL 3.125 MG PO TABS
3.1250 mg | ORAL_TABLET | Freq: Two times a day (BID) | ORAL | Status: DC
  Administered 2023-09-08 (×2): 3.125 mg via ORAL
  Filled 2023-09-08 (×2): qty 1

## 2023-09-08 MED ORDER — LOVASTATIN 20 MG PO TABS: 20.0000 mg | ORAL_TABLET | Freq: Every day | ORAL | 0 refills | Status: AC

## 2023-09-08 NOTE — Progress Notes (Signed)
*  PRELIMINARY RESULTS* Echocardiogram 2D Echocardiogram has been performed.  Sarah [name] 09/08/2023, 12:21 PM

## 2023-09-08 NOTE — ED Triage Notes (Signed)
 Pt states she was just discharged from hospital this evening. States she started having anxiety while waiting on her ride and decided to check back in for further evaluation.

## 2023-09-08 NOTE — ED Provider Notes (Signed)
 AP-EMERGENCY DEPT [facility] Emergency Department Provider Note MRN:  [id number]  Arrival date & time: 09/08/23     Chief Complaint   Panic Attack   History of Present Illness   Sarah [name] is a 43 y.o. year-old female with a history of hypertension presenting to the ED with chief complaint of panic attack.  Feeling intermittent shortness of breath and funny feeling in the chest accompanied by panic, anxiety, PTSD flares.  Happening intermittently over the past 1 or 2 days.  Last smoked meth yesterday which may be contributing.  Review of Systems  A thorough review of systems was obtained and all systems are negative except as noted in the HPI and PMH.   Patient's Health History    Past Medical History:  Diagnosis Date   Anxiety    Hypertension    Ovarian cancer [facility])     Past Surgical History:  Procedure Laterality Date   CESAREAN SECTION      History reviewed. No pertinent family history.  Social History   Socioeconomic History   Marital status: Single    Spouse name: Not on file   Number of children: Not on file   Years of education: Not on file   Highest education level: Not on file  Occupational History   Not on file  Tobacco Use   Smoking status: Every Day    Current packs/day: 1.00    Types: Cigarettes   Smokeless tobacco: Never  Substance and Sexual Activity   Alcohol use: Yes    Comment: Occ   Drug use: Yes    Types: Marijuana, Methamphetamines    Comment: last use 09/07/23   Sexual activity: Yes    Birth control/protection: None  Other Topics Concern   Not on file  Social History Narrative   Not on file   Social Drivers of Health   Financial Resource Strain: Not on file  Food Insecurity: Not on file  Transportation Needs: Not on file  Physical Activity: Not on file  Stress: Not on file  Social Connections: Not on file  Intimate Partner Violence: Not on file     Physical Exam   Vitals:   09/08/23 0617 09/08/23 0620  BP:     Pulse:    Resp: (!) 22 18  Temp:    SpO2:      CONSTITUTIONAL: Chronically ill-appearing, NAD NEURO/PSYCH:  Alert and oriented x 3, no focal deficits EYES:  eyes equal and reactive ENT/NECK:  no LAD, no JVD CARDIO: Regular rate, well-perfused, normal S1 and S2 PULM:  CTAB no wheezing or rhonchi GI/GU:  non-distended, non-tender MSK/SPINE:  No gross deformities, no edema SKIN:  no rash, atraumatic   *Additional and/or pertinent findings included in MDM below  Diagnostic and Interventional Summary    EKG Interpretation Date/Time:  Wednesday September 08 2023 04:27:14 EDT Ventricular Rate:  78 PR Interval:  146 QRS Duration:  98 QT Interval:  448 QTC Calculation: 511 R Axis:   25  Text Interpretation: Sinus rhythm Ventricular premature complex Probable left atrial enlargement Borderline T abnormalities, diffuse leads Prolonged QT interval Confirmed by Theadore Sharper [phone]) on 09/08/2023 4:58:48 AM       Labs Reviewed  BASIC METABOLIC PANEL WITH GFR - Abnormal; Notable for the following components:      Result Value   Potassium 2.8 (*)    Calcium 8.5 (*)    All other components within normal limits  TROPONIN I (HIGH SENSITIVITY) - Abnormal; Notable for the following  components:   Troponin I (High Sensitivity) 87 (*)    All other components within normal limits  CBC  HCG, SERUM, QUALITATIVE  TROPONIN I (HIGH SENSITIVITY)    DG Chest Port 1 View  Final Result      Medications  enoxaparin  (LOVENOX ) injection 40 mg (has no administration in time range)  acetaminophen  (TYLENOL ) tablet 650 mg (has no administration in time range)    Or  acetaminophen  (TYLENOL ) suppository 650 mg (has no administration in time range)  oxyCODONE  (Oxy IR/ROXICODONE ) immediate release tablet 5 mg (has no administration in time range)  ondansetron  (ZOFRAN ) tablet 4 mg (has no administration in time range)    Or  ondansetron  (ZOFRAN ) injection 4 mg (has no administration in time range)   carvedilol  (COREG ) tablet 3.125 mg (has no administration in time range)  potassium chloride  10 mEq in 100 mL IVPB (10 mEq Intravenous New Bag/Given 09/08/23 0605)  potassium chloride  SA (KLOR-CON  M) CR tablet 40 mEq (40 mEq Oral Given 09/08/23 0558)  aspirin  chewable tablet 324 mg (324 mg Oral Given 09/08/23 0559)     Procedures  /  Critical Care .Critical Care  Performed by: Theadore Ozell HERO, MD Authorized by: Theadore Ozell HERO, MD   Critical care provider statement:    Critical care time (minutes):  35   Critical care was necessary to treat or prevent imminent or life-threatening deterioration of the following conditions: NSTEMI.   Critical care was time spent personally by me on the following activities:  Development of treatment plan with patient or surrogate, discussions with consultants, evaluation of patient's response to treatment, examination of patient, ordering and review of laboratory studies, ordering and review of radiographic studies, ordering and performing treatments and interventions, pulse oximetry, re-evaluation of patient's condition and review of old charts   ED Course and Medical Decision Making  Initial Impression and Ddx Suspect panic or anxiety related symptoms, possibly triggered by methamphetamine use.  Or could be experiencing heart strain related to sympathomimetics.  Awaiting labs.  Past medical/surgical history that increases complexity of ED encounter: Substance use disorder  Interpretation of Diagnostics I personally reviewed the EKG and my interpretation is as follows: Sinus rhythm without concerning findings  Hypokalemia noted.  Troponin elevation greater than 80  Patient Reassessment and Ultimate Disposition/Management     Admitted to hospital service for further care.  Patient management required discussion with the following services or consulting groups:  Hospitalist Service  Complexity of Problems Addressed Acute illness or injury that poses  threat of life of bodily function  Additional Data Reviewed and Analyzed Further history obtained from: Prior labs/imaging results  Additional Factors Impacting ED Encounter Risk Consideration of hospitalization  Ozell HERO. Theadore, MD [facility] Emergency Medicine [facility] [email or web address]  Final Clinical Impressions(s) / ED Diagnoses     ICD-10-CM   1. NSTEMI (non-ST elevated myocardial infarction) (HCC)  I21.4     2. 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

## 2023-09-08 NOTE — TOC Transition Note (Signed)
 Transition of Care [facility]) - Discharge Note   Patient Details  Name: Sarah [name] MRN: [id number] Date of Birth: February 06, 1981  Transition of Care [facility] CM/SW Contact:  Sharlyne Stabs, RN Phone Number: 09/08/2023, 3:38 PM   Clinical Narrative:   Patient discharging home. Patient has no PCP, no Insurance.  CM at the bedside to explain the care connect program, handout given and patient is agreeable to the referral.  Referral made to [facility]. They will follow up later this evening or in the morning.     Final next level of care: Home/Self Care Barriers to Discharge: Barriers Resolved   Patient Goals and CMS Choice Patient states their goals for this hospitalization and ongoing recovery are:: return home CMS Medicare.gov Compare Post Acute Care list provided to:: Patient Choice offered to / list presented to : Patient [place] ownership interest in [facility].provided to:: Patient    Discharge Placement                    Patient and family notified of of transfer: 09/08/23  Discharge Plan and Services Additional resources added to the After Visit Summary for                                       Social Drivers of Health (SDOH) Interventions SDOH Screenings   Food Insecurity: No Food Insecurity (09/08/2023)  Housing: Low Risk  (09/08/2023)  Transportation Needs: No Transportation Needs (09/08/2023)  Utilities: Not At Risk (09/08/2023)  Tobacco Use: High Risk (09/08/2023)     Readmission Risk Interventions     No data to display

## 2023-09-08 NOTE — ED Triage Notes (Signed)
 C/o panic attack/ anxiety and states if feels strange when I Breathe in on the right side. Pt states started after smoking a cigarette at 2100 7/22.

## 2023-09-08 NOTE — Progress Notes (Signed)
 Patient up to restroom, per patient she urinated, cap in commode to catch urine prior to patient going in there, no evidence of patient voiding. Patient educated that we need urine specimen

## 2023-09-08 NOTE — Plan of Care (Signed)
   Problem: Activity: Goal: Risk for activity intolerance will decrease Outcome: Progressing

## 2023-09-08 NOTE — Progress Notes (Incomplete)
 Patient stated she now has no ride or anywhere to go upon discharge, social work and house supervisor made aware, orders to discharge patient and have her wait for a friend to come get her in the lobby.

## 2023-09-08 NOTE — Discharge Summary (Addendum)
 Physician Discharge Summary   Patient: Sarah [name] MRN: [id number] DOB: 08-14-1980  Admit date:     09/08/2023  Discharge date: 09/08/23  Discharge Physician: Adriana DELENA Grams   PCP: Patient, No Pcp Per   Recommendations at discharge:   Recommending identifying PCP and follow-up closely Avoiding tobacco use, use alcohol and street drugs Continue current medications   Hospital Course: RAYLEA ADCOX is a 43 y.o. female with medical history significant of hypertension, anxiety who presents emergency department due to chest discomfort and panic attack.  Patient's been having intermittent shortness of breath and worsening anxiety over the last day.  She has been smoking methamphetamine.  Pain was substernal and nonexertional.    ED:  Hemodynamically stable.  Labs notable for potassium 2.8, troponin 87. Chest x-ray which showed no acute findings.  Patient was further workup.  On admission patient endorsed methamphetamine usage.  Chest pain was associated with usage of this medication.  No exertional component.  No previous cardiac workup or cardiac comorbidities.  Patient has history of hypertension was previously on clonidine  and metoprolol  and has not been taking these medications.    Chest pain - Resolved - Patient endorsing chest, shortness of breath in setting of methamphetamine usage - Nonexertional - No cardiac comorbidities including hyperlipidemia - No prior cardiac issues leukocytic superlow: - Echocardiogram: IMPRESSIONS LV EJF 60 to 65%. LV normal function. The left ventricle has no regional  wall motion abnormalities. Left ventricular diastolic parameters were  normal.  2. Right ventricular systolic function is normal. The right ventricular  size is normal.  3. The mitral valve is normal in structure. Mild mitral valve  regurgitation.   4. The aortic valve is tricuspid. Aortic valve regurgitation is not  visualized. Aortic valve sclerosis is present, with no  evidence of aortic  valve stenosis.   5. The inferior vena cava is normal in size with greater than 50%  respiratory variability, suggesting right atrial pressure of 3 mmHg.   Trend troponin: 87, 77 -EKG-no changes Encourage methamphetamine cessation    UDS- encourage cessation (patient refused to provide urine)    Hyporkalemia - replete potassium   Hypertension - continue carvedilol    Disposition: Home Diet recommendation:  Discharge Diet Orders (From admission, onward)     Start     Ordered   09/08/23 0000  Diet - low sodium heart healthy        09/08/23 1156           Cardiac diet DISCHARGE MEDICATION: Allergies as of 09/08/2023   No Known Allergies      Medication List     STOP taking these medications    amoxicillin  500 MG capsule Commonly known as: AMOXIL    cloNIDine  0.2 MG tablet Commonly known as: CATAPRES    HYDROcodone -acetaminophen  5-325 MG tablet Commonly known as: NORCO/VICODIN   LISINOPRIL PO       TAKE these medications    acetaminophen  500 MG tablet Commonly known as: TYLENOL  Take 1,000 mg by mouth every 6 (six) hours as needed. For headache.   aspirin  81 MG chewable tablet Chew 1 tablet (81 mg total) by mouth daily. Start taking on: September 09, 2023   ibuprofen 200 MG tablet Commonly known as: ADVIL Take 400 mg by mouth every 6 (six) hours as needed. Patient used this medication for her stomach pain.   lovastatin  20 MG tablet Commonly known as: MEVACOR  Take 1 tablet (20 mg total) by mouth at bedtime.   metoprolol  tartrate 25 MG  tablet Commonly known as: LOPRESSOR  Take 1 tablet (25 mg total) by mouth 2 (two) times daily.        Discharge Exam: Filed Weights   09/08/23 0319  Weight: 56.7 kg        General:  AAO x 3,  cooperative, no distress;   HEENT:  Normocephalic, PERRL, otherwise with in Normal limits   Neuro:  CNII-XII intact. , normal motor and sensation, reflexes intact   Lungs:   Clear to auscultation BL,  Respirations unlabored,  No wheezes / crackles  Cardio:    S1/S2, RRR, No murmure, No Rubs or Gallops   Abdomen:  Soft, non-tender, bowel sounds active all four quadrants, no guarding or peritoneal signs.  Muscular  skeletal:  Limited exam -global generalized weaknesses - in bed, able to move all 4 extremities,   2+ pulses,  symmetric, No pitting edema  Skin:  Dry, warm to touch, negative for any Rashes,  Wounds: Please see nursing documentation          Condition at discharge: good  The results of significant diagnostics from this hospitalization (including imaging, microbiology, ancillary and laboratory) are listed below for reference.   Imaging Studies: ECHOCARDIOGRAM COMPLETE Result Date: 09/08/2023    ECHOCARDIOGRAM REPORT   Patient Name:   Sarah [name] Date of Exam: 09/08/2023 Medical Rec #:  [id number]           Height:       61.0 in Accession #:    [id number]          Weight:       125.0 lb Date of Birth:  1980-05-09           BSA:          1.547 m Patient Age:    43 years            BP:           132/91 mmHg Patient Gender: F                   HR:           67 bpm. Exam Location:  [facility] Procedure: 2D Echo, Cardiac Doppler and Color Doppler (Both Spectral and Color            Flow Doppler were utilized during procedure). Indications:    Chest Pain R07.9  History:        Patient has no prior history of Echocardiogram examinations.                 Risk Factors:Hypertension and Current Smoker. Hx of smoking                 methamphetamine.  Sonographer:    Aida Pizza RCS Referring Phys: [id number] ROBERT DORRELL IMPRESSIONS  1. Left ventricular ejection fraction, by estimation, is 60 to 65%. The left ventricle has normal function. The left ventricle has no regional wall motion abnormalities. Left ventricular diastolic parameters were normal.  2. Right ventricular systolic function is normal. The right ventricular size is normal.  3. The mitral valve is normal in structure. Mild  mitral valve regurgitation.  4. The aortic valve is tricuspid. Aortic valve regurgitation is not visualized. Aortic valve sclerosis is present, with no evidence of aortic valve stenosis.  5. The inferior vena cava is normal in size with greater than 50% respiratory variability, suggesting right atrial pressure of 3 mmHg. FINDINGS  Left Ventricle: Left ventricular ejection fraction, by estimation,  is 60 to 65%. The left ventricle has normal function. The left ventricle has no regional wall motion abnormalities. The left ventricular internal cavity size was normal in size. There is  no left ventricular hypertrophy. Left ventricular diastolic parameters were normal. Right Ventricle: The right ventricular size is normal. Right vetricular wall thickness was not assessed. Right ventricular systolic function is normal. Left Atrium: Left atrial size was normal in size. Right Atrium: Right atrial size was normal in size. Pericardium: There is no evidence of pericardial effusion. Mitral Valve: The mitral valve is normal in structure. Mild mitral valve regurgitation. Tricuspid Valve: The tricuspid valve is normal in structure. Tricuspid valve regurgitation is trivial. Aortic Valve: The aortic valve is tricuspid. Aortic valve regurgitation is not visualized. Aortic valve sclerosis is present, with no evidence of aortic valve stenosis. Pulmonic Valve: The pulmonic valve was not well visualized. Pulmonic valve regurgitation is not visualized. No evidence of pulmonic stenosis. Aorta: The aortic root is normal in size and structure. Venous: The inferior vena cava is normal in size with greater than 50% respiratory variability, suggesting right atrial pressure of 3 mmHg. IAS/Shunts: No atrial level shunt detected by color flow Doppler.  LEFT VENTRICLE PLAX 2D LVIDd:         5.00 cm   Diastology LVIDs:         3.30 cm   LV e' medial:    6.53 cm/s LV PW:         1.00 cm   LV E/e' medial:  9.8 LV IVS:        1.00 cm   LV e' lateral:    7.07 cm/s LVOT diam:     1.90 cm   LV E/e' lateral: 9.1 LV SV:         56 LV SV Index:   36 LVOT Area:     2.84 cm  RIGHT VENTRICLE RV S prime:     9.57 cm/s TAPSE (M-mode): 1.6 cm LEFT ATRIUM             Index        RIGHT ATRIUM           Index LA diam:        3.00 cm 1.94 cm/m   RA Area:     11.00 cm LA Vol (A2C):   37.9 ml 24.50 ml/m  RA Volume:   25.00 ml  16.16 ml/m LA Vol (A4C):   29.6 ml 19.14 ml/m LA Biplane Vol: 34.1 ml 22.05 ml/m  AORTIC VALVE LVOT Vmax:   103.00 cm/s LVOT Vmean:  66.800 cm/s LVOT VTI:    0.199 m  AORTA Ao Root diam: 3.00 cm MITRAL VALVE MV Area (PHT): 3.21 cm    SHUNTS MV Decel Time: 236 msec    Systemic VTI:  0.20 m MV E velocity: 64.30 cm/s  Systemic Diam: 1.90 cm MV A velocity: 80.10 cm/s MV E/A ratio:  0.80 Vina Gull MD Electronically signed by Vina Gull MD Signature Date/Time: 09/08/2023/12:34:13 PM    Final    DG Chest Port 1 View Result Date: 09/08/2023 EXAM: 1 VIEW XRAY OF THE CHEST 09/08/2023 04:10:00 AM COMPARISON: 1 view chest x-ray 08/18/2023. CLINICAL HISTORY: Awoke with chest pain to the right anterior side, difficulty breathing, feeling anxious, questions whether allergic reaction. FINDINGS: LUNGS AND PLEURA: No focal pulmonary opacity. No pulmonary edema. No pleural effusion. No pneumothorax. HEART AND MEDIASTINUM: No acute abnormality of the cardiac and mediastinal silhouettes. BONES AND SOFT TISSUES: No acute  osseous abnormality. IMPRESSION: 1. No acute process. Electronically signed by: Lonni Necessary MD 09/08/2023 04:43 AM EDT RP Workstation: [id number]   CT Angio Chest PE W and/or Wo Contrast Result Date: 08/19/2023 CLINICAL DATA:  Chest pain with shortness of breath, nausea and dizziness. EXAM: CT ANGIOGRAPHY CHEST WITH CONTRAST TECHNIQUE: Multidetector CT imaging of the chest was performed using the standard protocol during bolus administration of intravenous contrast. Multiplanar CT image reconstructions and MIPs were obtained to evaluate the  vascular anatomy. RADIATION DOSE REDUCTION: This exam was performed according to the departmental dose-optimization program which includes automated exposure control, adjustment of the mA and/or kV according to patient size and/or use of iterative reconstruction technique. CONTRAST:  75mL OMNIPAQUE  IOHEXOL  350 MG/ML SOLN COMPARISON:  None Available. FINDINGS: Cardiovascular: The thoracic aorta is normal in appearance. Satisfactory opacification of the pulmonary arteries to the segmental level. No evidence of pulmonary embolism. Normal heart size. No pericardial effusion. Mediastinum/Nodes: No enlarged mediastinal, hilar, or axillary lymph nodes. Thyroid gland, trachea, and esophagus demonstrate no significant findings. Lungs/Pleura: Lungs are clear. No pleural effusion or pneumothorax. Upper Abdomen: No acute abnormality. Musculoskeletal: No chest wall abnormality. No acute or significant osseous findings. Review of the MIP images confirms the above findings. IMPRESSION: No evidence of pulmonary embolism or other acute intrathoracic process. Electronically Signed   By: Suzen Dials M.D.   On: 08/19/2023 01:33   DG Chest Port 1 View Result Date: 08/18/2023 CLINICAL DATA:  Chest pain with nausea, shortness of breath and dizziness after meth use. EXAM: PORTABLE CHEST 1 VIEW COMPARISON:  January 25, 2007 FINDINGS: The heart size and mediastinal contours are within normal limits. Both lungs are clear. The visualized skeletal structures are unremarkable. IMPRESSION: No active disease. Electronically Signed   By: Suzen Dials M.D.   On: 08/18/2023 23:33    Microbiology: Results for orders placed or performed during the hospital encounter of 08/18/23  Blood Culture (routine x 2)     Status: None   Collection Time: 08/19/23 12:08 AM   Specimen: BLOOD  Result Value Ref Range Status   Specimen Description   Final    BLOOD BLOOD LEFT HAND Performed at [facility], [address]  [place], [place] [address]    Special Requests   Final    BOTTLES DRAWN AEROBIC ONLY Blood Culture results may not be optimal due to an inadequate volume of blood received in culture bottles Performed at [facility], [address], [place], [place] [address]    Culture   Final    NO GROWTH 5 DAYS Performed at [facility], [address] [place], [place] [address]    Report Status 08/24/2023 FINAL  Final  Resp panel by RT-PCR (RSV, Flu A&B, Covid) Anterior Nasal Swab     Status: None   Collection Time: 08/19/23 12:10 AM   Specimen: Anterior Nasal Swab  Result Value Ref Range Status   SARS Coronavirus 2 by RT PCR NEGATIVE NEGATIVE Final    Comment: (NOTE) SARS-CoV-2 target nucleic acids are NOT DETECTED.  The SARS-CoV-2 RNA is generally detectable in upper respiratory specimens during the acute phase of infection. The lowest concentration of SARS-CoV-2 viral copies this assay can detect is 138 copies/mL. A negative result does not preclude SARS-Cov-2 infection and should not be used as the sole basis for treatment or other patient management decisions. A negative result may occur with  improper specimen collection/handling, submission of specimen other than nasopharyngeal swab, presence of  viral mutation(s) within the areas targeted by this assay, and inadequate number of viral copies(<138 copies/mL). A negative result must be combined with clinical observations, patient history, and epidemiological information. The expected result is Negative.  Fact Sheet for Patients:  BloggerCourse.com  Fact Sheet for Healthcare Providers:  SeriousBroker.it  This test is no t yet approved or cleared by the [place]  FDA and  has been authorized for detection and/or diagnosis of SARS-CoV-2 by FDA under an Emergency Use Authorization (EUA). This EUA will remain  in effect (meaning this test can be used) for the  duration of the COVID-19 declaration under Section 564(b)(1) of the Act, 21 U.S.C.section 360bbb-3(b)(1), unless the authorization is terminated  or revoked sooner.       Influenza A by PCR NEGATIVE NEGATIVE Final   Influenza B by PCR NEGATIVE NEGATIVE Final    Comment: (NOTE) The Xpert Xpress SARS-CoV-2/FLU/RSV plus assay is intended as an aid in the diagnosis of influenza from Nasopharyngeal swab specimens and should not be used as a sole basis for treatment. Nasal washings and aspirates are unacceptable for Xpert Xpress SARS-CoV-2/FLU/RSV testing.  Fact Sheet for Patients: BloggerCourse.com  Fact Sheet for Healthcare Providers: SeriousBroker.it  This test is not yet approved or cleared by the [place]  FDA and has been authorized for detection and/or diagnosis of SARS-CoV-2 by FDA under an Emergency Use Authorization (EUA). This EUA will remain in effect (meaning this test can be used) for the duration of the COVID-19 declaration under Section 564(b)(1) of the Act, 21 U.S.C. section 360bbb-3(b)(1), unless the authorization is terminated or revoked.     Resp Syncytial Virus by PCR NEGATIVE NEGATIVE Final    Comment: (NOTE) Fact Sheet for Patients: BloggerCourse.com  Fact Sheet for Healthcare Providers: SeriousBroker.it  This test is not yet approved or cleared by the [place]  FDA and has been authorized for detection and/or diagnosis of SARS-CoV-2 by FDA under an Emergency Use Authorization (EUA). This EUA will remain in effect (meaning this test can be used) for the duration of the COVID-19 declaration under Section 564(b)(1) of the Act, 21 U.S.C. section 360bbb-3(b)(1), unless the authorization is terminated or revoked.  Performed at [facility], [address], [place], [place] [address]   Blood Culture (routine x 2)     Status: None    Collection Time: 08/19/23 12:10 AM   Specimen: BLOOD  Result Value Ref Range Status   Specimen Description   Final    BLOOD LEFT ANTECUBITAL Performed at [facility], [address] [place], [place] [address]    Special Requests   Final    BOTTLES DRAWN AEROBIC AND ANAEROBIC Blood Culture adequate volume Performed at [facility], [address] [place], [place] [address]    Culture   Final    NO GROWTH 5 DAYS Performed at [facility], [address], [place], [place] [address]    Report Status 08/24/2023 FINAL  Final    Labs: CBC: Recent Labs  Lab 09/08/23 0438  WBC 7.7  HGB 12.7  HCT 37.1  MCV 93.2  PLT 329   Basic Metabolic Panel: Recent Labs  Lab 09/08/23 0438  NA 139  K 2.8*  CL 102  CO2 26  GLUCOSE 90  BUN 10  CREATININE 0.65  CALCIUM 8.5*  MG 1.9   Liver Function Tests: No results for input(s): AST, ALT, ALKPHOS, BILITOT, PROT, ALBUMIN in the last 168 hours. CBG: No results for input(s): GLUCAP  in the last 168 hours.  Discharge time spent: greater than 30 minutes.  Signed: Adriana DELENA Grams, MD [facility] 09/08/2023

## 2023-09-08 NOTE — Hospital Course (Addendum)
 Sarah [name] is a 43 y.o. female with medical history significant of hypertension, anxiety who presents emergency department due to chest discomfort and panic attack.  Patient's been having intermittent shortness of breath and worsening anxiety over the last day.  She has been smoking methamphetamine.  Pain was substernal and nonexertional.    ED:  Hemodynamically stable.  Labs notable for potassium 2.8, troponin 87. Chest x-ray which showed no acute findings.  Patient was further workup.  On admission patient endorsed methamphetamine usage.  Chest pain was associated with usage of this medication.  No exertional component.  No previous cardiac workup or cardiac comorbidities.  Patient has history of hypertension was previously on clonidine  and metoprolol  and has not been taking these medications.   Assessment/Plan Principal Problem:   Chest pain   Chest pain -  - Patient endorsing chest, shortness of breath in setting of methamphetamine usage - Nonexertional - No cardiac comorbidities including hyperlipidemia - No prior cardiac issues leukocytic superlow: Plan: Obtain echocardiogram Trend troponin Encourage methamphetamine cessation    UDS- encourage cessation   Hyporkalemia - replete potassium   Hypertension - continue carvedilol 

## 2023-09-08 NOTE — H&P (Signed)
 History and Physical    Sarah [name] FMW:[id number] DOB: 1980/06/27 DOA: 09/08/2023  PCP: Patient, No Pcp Per   Chief Complaint:  chest pain  HPI: Sarah [name] is a 43 y.o. female with medical history significant of hypertension, anxiety who presents emergency department due to chest discomfort and panic attack.  Patient's been having intermittent shortness of breath and worsening anxiety over the last day.  She has been smoking methamphetamine.  Pain was substernal and nonexertional.  She presented to the ER where she was found to be afebrile and hemodynamically stable.  Labs notable for potassium 2.8, troponin 87.  Patient underwent chest x-ray which showed no acute findings.  Patient was further workup.  On admission patient endorsed methamphetamine usage.  Chest pain was associated with usage of this medication.  No exertional component.  No previous cardiac workup or cardiac comorbidities.  Patient has history of hypertension was previously on clonidine  and metoprolol  and has not been taking these medications.   Review of Systems: Review of Systems  Constitutional:  Negative for chills and fever.  HENT: Negative.    Eyes: Negative.   Respiratory: Negative.    Cardiovascular:  Positive for chest pain.  Gastrointestinal: Negative.   Genitourinary: Negative.   Musculoskeletal: Negative.   Skin: Negative.   Neurological: Negative.   Endo/Heme/Allergies: Negative.   Psychiatric/Behavioral: Negative.       As per HPI otherwise 10 point review of systems negative.   No Known Allergies  Past Medical History:  Diagnosis Date   Anxiety    Hypertension    Ovarian cancer [facility])     Past Surgical History:  Procedure Laterality Date   CESAREAN SECTION       reports that she has been smoking cigarettes. She has never used smokeless tobacco. She reports current alcohol use. She reports current drug use. Drugs: Marijuana and Methamphetamines.  History reviewed. No  pertinent family history.  Prior to Admission medications   Medication Sig Start Date End Date Taking? Authorizing Provider  acetaminophen  (TYLENOL ) 500 MG tablet Take 1,000 mg by mouth every 6 (six) hours as needed. For headache.    [provider]  amoxicillin  (AMOXIL ) 500 MG capsule Take 1 capsule (500 mg total) by mouth 2 (two) times daily. 07/30/15   Harl Jayson CROME, MD  cloNIDine  (CATAPRES ) 0.2 MG tablet Take 0.2 mg by mouth 2 (two) times daily. 08/04/11 08/03/12  Doretha Folks, MD  HYDROcodone -acetaminophen  (NORCO/VICODIN) 5-325 MG per tablet Take 2 tablets by mouth every 4 (four) hours as needed for pain. 04/19/12   Pickering, Vrinda, NP  ibuprofen (ADVIL,MOTRIN) 200 MG tablet Take 400 mg by mouth every 6 (six) hours as needed. Patient used this medication for her stomach pain.    [provider]  LISINOPRIL PO Take by mouth.    [provider]  lovastatin  (MEVACOR ) 20 MG tablet Take 20 mg by mouth at bedtime.    [provider]  metoprolol  tartrate (LOPRESSOR ) 25 MG tablet Take 25 mg by mouth 2 (two) times daily.    [provider]    Physical Exam: Vitals:   09/08/23 0319 09/08/23 0322 09/08/23 0325 09/08/23 0326  BP:   (!) 133/95   Pulse:   83 85  Temp:  (!) 79.9 F (26.6 C)  97.9 F (36.6 C)  TempSrc:  Oral  Oral  SpO2:   96% 97%  Weight: 56.7 kg     Height: 5' 1 (1.549 m)      Physical  Exam Constitutional:      Appearance: She is normal weight.  HENT:     Head: Normocephalic.     Nose: Nose normal.     Mouth/Throat:     Mouth: Mucous membranes are moist.  Eyes:     Pupils: Pupils are equal, round, and reactive to light.  Cardiovascular:     Rate and Rhythm: Normal rate and regular rhythm.     Pulses: Normal pulses.  Pulmonary:     Effort: Pulmonary effort is normal.  Abdominal:     General: Abdomen is flat. Bowel sounds are normal.  Musculoskeletal:        General: Normal range of motion.     Cervical back:  Normal range of motion.  Skin:    General: Skin is warm.     Capillary Refill: Capillary refill takes less than 2 seconds.  Neurological:     General: No focal deficit present.     Mental Status: She is alert.  Psychiatric:        Mood and Affect: Mood normal.        Labs on Admission: I have personally reviewed the patients's labs and imaging studies.  Assessment/Plan Principal Problem:   Chest pain   # Chest pain - Patient endorsing chest, shortness of breath in setting of methamphetamine usage - Nonexertional - No cardiac comorbidities including hyperlipidemia - No prior cardiac issues leukocytic superlow: Plan: Obtain echocardiogram Trend troponin Encourage methamphetamine cessation  #UDS- encourage cessation  # hyperkalemia-replete potassium  # Hypertension-continue carvedilol    Admission status: Observation Telemetry  Certification: The appropriate patient status for this patient is OBSERVATION. Observation status is judged to be reasonable and necessary in order to provide the required intensity of service to ensure the patient's safety. The patient's presenting symptoms, physical exam findings, and initial radiographic and laboratory data in the context of their medical condition is felt to place them at decreased risk for further clinical deterioration. Furthermore, it is anticipated that the patient will be medically stable for discharge from the hospital within 2 midnights of admission.     Lamar Dess MD [facility] If 7PM-7AM, please contact night-coverage [email or web address]  09/08/2023, 5:43 AM

## 2023-09-08 NOTE — ED Provider Notes (Signed)
 [facility]-EMERGENCY DEPT [facility] Emergency Department Provider Note MRN:  [id number]  Arrival date & time: 09/09/23     Chief Complaint   Anxiety   History of Present Illness   Sarah [name] is a 43 y.o. year-old female with a history of hypertension presenting to the ED with chief complaint of anxiety.  Discharged earlier today, did not have a ride out of town.  Had a mild episode of return of symptoms from yesterday.  Described as anxiety, chest discomfort, shortness of breath, nausea, dizziness.  Brief and then resolved.  Review of Systems  A thorough review of systems was obtained and all systems are negative except as noted in the HPI and PMH.   Patient's Health History    Past Medical History:  Diagnosis Date   Anxiety    Hypertension    Ovarian cancer [facility])     Past Surgical History:  Procedure Laterality Date   CESAREAN SECTION      History reviewed. No pertinent family history.  Social History   Socioeconomic History   Marital status: Single    Spouse name: Not on file   Number of children: Not on file   Years of education: Not on file   Highest education level: Not on file  Occupational History   Not on file  Tobacco Use   Smoking status: Every Day    Current packs/day: 1.00    Types: Cigarettes   Smokeless tobacco: Never  Substance and Sexual Activity   Alcohol use: Yes    Comment: Occ   Drug use: Yes    Types: Marijuana, Methamphetamines    Comment: last use 09/07/23   Sexual activity: Yes    Birth control/protection: None  Other Topics Concern   Not on file  Social History Narrative   Not on file   Social Drivers of Health   Financial Resource Strain: Not on file  Food Insecurity: No Food Insecurity (09/08/2023)   Hunger Vital Sign    Worried About Running Out of Food in the Last Year: Never true    Ran Out of Food in the Last Year: Never true  Transportation Needs: No Transportation Needs (09/08/2023)   PRAPARE - Therapist, art (Medical): No    Lack of Transportation (Non-Medical): No  Physical Activity: Not on file  Stress: Not on file  Social Connections: Not on file  Intimate Partner Violence: Not At Risk (09/08/2023)   Humiliation, Afraid, Rape, and Kick questionnaire    Fear of Current or Ex-Partner: No    Emotionally Abused: No    Physically Abused: No    Sexually Abused: No     Physical Exam   Vitals:   09/08/23 2301 09/08/23 2330  BP: (!) 118/90 (!) 132/90  Pulse: 96 88  Resp: 16 20  Temp: 98.4 F (36.9 C)   SpO2: 96% 98%    CONSTITUTIONAL: Well-appearing, NAD NEURO/PSYCH:  Alert and oriented x 3, no focal deficits EYES:  eyes equal and reactive ENT/NECK:  no LAD, no JVD CARDIO: Regular rate, well-perfused, normal S1 and S2 PULM:  CTAB no wheezing or rhonchi GI/GU:  non-distended, non-tender MSK/SPINE:  No gross deformities, no edema SKIN:  no rash, atraumatic   *Additional and/or pertinent findings included in MDM below  Diagnostic and Interventional Summary    EKG Interpretation Date/Time:  Wednesday September 08 2023 23:32:26 EDT Ventricular Rate:  84 PR Interval:  139 QRS Duration:  90 QT Interval:  392 QTC  Calculation: 464 R Axis:   40  Text Interpretation: Sinus rhythm Confirmed by Theadore Sharper [phone]) on 09/08/2023 11:41:01 PM       Labs Reviewed  BASIC METABOLIC PANEL WITH GFR - Abnormal; Notable for the following components:      Result Value   Calcium 8.8 (*)    All other components within normal limits  TROPONIN I (HIGH SENSITIVITY) - Abnormal; Notable for the following components:   Troponin I (High Sensitivity) 37 (*)    All other components within normal limits  TROPONIN I (HIGH SENSITIVITY) - Abnormal; Notable for the following components:   Troponin I (High Sensitivity) 37 (*)    All other components within normal limits  CBC    No orders to display    Medications - No data to display   Procedures  /  Critical  Care Procedures  ED Course and Medical Decision Making  Initial Impression and Ddx Well-appearing in no acute distress normal vital signs.  Was admitted for NSTEMI yesterday in the setting of methamphetamine use.  Denies any further use of illicit drugs.  Symptoms brief and now resolved.  Could be anxiety but given patient's recent history will need to repeat evaluation with troponin, EKG.  Past medical/surgical history that increases complexity of ED encounter: Substance use disorder  Interpretation of Diagnostics I personally reviewed the EKG and my interpretation is as follows: Sinus rhythm without concerning ischemic findings or changes  No significant blood count or electrolyte disturbance.  Troponin minimally elevated and flat upon repeat.  Patient Reassessment and Ultimate Disposition/Management     Patient has been asymptomatic for several hours, appropriate for discharge.  Patient management required discussion with the following services or consulting groups:  None  Complexity of Problems Addressed Acute illness or injury that poses threat of life of bodily function  Additional Data Reviewed and Analyzed Further history obtained from: Recent discharge summary and Prior labs/imaging results  Additional Factors Impacting ED Encounter Risk Consideration of hospitalization  Sharper HERO. Theadore, MD [facility] Emergency Medicine [facility] [email or web address]  Final Clinical Impressions(s) / ED Diagnoses     ICD-10-CM   1. Anxiety  F41.9     2. Chest pain, unspecified type  R07.9       ED Discharge Orders     None        Discharge Instructions Discussed with and Provided to Patient:     Discharge Instructions      You were evaluated in the Emergency Department and after careful evaluation, we did not find any emergent condition requiring admission or further testing in the hospital.  Your exam/testing today was overall  reassuring.  Please return to the Emergency Department if you experience any worsening of your condition.  Thank you for allowing us  to be a part of your care.        Theadore Sharper HERO, MD 09/09/23 [phone]

## 2023-09-09 LAB — BASIC METABOLIC PANEL WITH GFR
Anion gap: 11 (ref 5–15)
BUN: 8 mg/dL (ref 6–20)
CO2: 23 mmol/L (ref 22–32)
Calcium: 8.8 mg/dL — ABNORMAL LOW (ref 8.9–10.3)
Chloride: 105 mmol/L (ref 98–111)
Creatinine, Ser: 0.78 mg/dL (ref 0.44–1.00)
GFR, Estimated: >60 mL/min (ref >60)
Glucose, Bld: 94 mg/dL (ref 70–99)
Potassium: 4.2 mmol/L (ref 3.5–5.1)
Sodium: 139 mmol/L (ref 135–145)

## 2023-09-09 LAB — CBC
HCT: 39.1 % (ref 36.0–46.0)
Hemoglobin: 13.4 g/dL (ref 12.0–15.0)
MCH: 32.5 pg (ref 26.0–34.0)
MCHC: 34.3 g/dL (ref 30.0–36.0)
MCV: 94.9 fL (ref 80.0–100.0)
Platelets: 351 K/uL (ref 150–400)
RBC: 4.12 MIL/uL (ref 3.87–5.11)
RDW: 11.9 % (ref 11.5–15.5)
WBC: 6.3 K/uL (ref 4.0–10.5)
nRBC: 0 % (ref 0.0–0.2)

## 2023-09-09 LAB — TROPONIN I (HIGH SENSITIVITY)
Troponin I (High Sensitivity): 37 ng/L — ABNORMAL HIGH (ref <18)
Troponin I (High Sensitivity): 37 ng/L — ABNORMAL HIGH (ref <18)

## 2023-09-09 NOTE — ED Notes (Signed)
 Discharge instructions reviewed.   Opportunity for questions and concerns provided.   Alert, oriented and ambulatory. Displays no signs of distress.

## 2023-09-09 NOTE — Discharge Instructions (Signed)
You were evaluated in the Emergency Department and after careful evaluation, we did not find any emergent condition requiring admission or further testing in the hospital.  Your exam/testing today was overall reassuring.  Please return to the Emergency Department if you experience any worsening of your condition.  Thank you for allowing us to be a part of your care.  

## 2023-09-16 ENCOUNTER — Telehealth: Payer: Self-pay

## 2023-09-16 NOTE — Telephone Encounter (Signed)
 Attempted to reach patient after receiving referral from [place] on 7/23 to discuss [facility] Connect services with patient. The number stated the phone currently doesn't have service. Patient is now showing [place] Medicaid coverage and is ineligible for Care Connect services at this time.

## 2023-10-07 ENCOUNTER — Emergency Department (HOSPITAL_BASED_OUTPATIENT_CLINIC_OR_DEPARTMENT_OTHER)
Admission: EM | Admit: 2023-10-07 | Discharge: 2023-10-07 | Disposition: A | Discharge status: Home / Self Care | Attending: Emergency Medicine | Admitting: Emergency Medicine

## 2023-10-07 ENCOUNTER — Encounter (HOSPITAL_BASED_OUTPATIENT_CLINIC_OR_DEPARTMENT_OTHER): Payer: Self-pay | Admitting: Emergency Medicine

## 2023-10-07 ENCOUNTER — Other Ambulatory Visit: Payer: Self-pay

## 2023-10-07 ENCOUNTER — Emergency Department (HOSPITAL_BASED_OUTPATIENT_CLINIC_OR_DEPARTMENT_OTHER)

## 2023-10-07 DIAGNOSIS — Z79899 Other long term (current) drug therapy: Secondary | ICD-10-CM | POA: Diagnosis not present

## 2023-10-07 DIAGNOSIS — Z8543 Personal history of malignant neoplasm of ovary: Secondary | ICD-10-CM | POA: Diagnosis not present

## 2023-10-07 DIAGNOSIS — Z7982 Long term (current) use of aspirin: Secondary | ICD-10-CM | POA: Insufficient documentation

## 2023-10-07 DIAGNOSIS — R0602 Shortness of breath: Secondary | ICD-10-CM | POA: Diagnosis present

## 2023-10-07 DIAGNOSIS — I10 Essential (primary) hypertension: Secondary | ICD-10-CM | POA: Insufficient documentation

## 2023-10-07 DIAGNOSIS — F17200 Nicotine dependence, unspecified, uncomplicated: Secondary | ICD-10-CM | POA: Diagnosis not present

## 2023-10-07 LAB — CBC WITH DIFFERENTIAL/PLATELET
Abs Immature Granulocytes: 0.05 K/uL (ref 0.00–0.07)
Basophils Absolute: 0.1 K/uL (ref 0.0–0.1)
Basophils Relative: 1 %
Eosinophils Absolute: 0.1 K/uL (ref 0.0–0.5)
Eosinophils Relative: 1 %
HCT: 38.1 % (ref 36.0–46.0)
Hemoglobin: 13.4 g/dL (ref 12.0–15.0)
Immature Granulocytes: 1 %
Lymphocytes Relative: 24 %
Lymphs Abs: 2.5 K/uL (ref 0.7–4.0)
MCH: 32.5 pg (ref 26.0–34.0)
MCHC: 35.2 g/dL (ref 30.0–36.0)
MCV: 92.5 fL (ref 80.0–100.0)
Monocytes Absolute: 0.7 K/uL (ref 0.1–1.0)
Monocytes Relative: 6 %
Neutro Abs: 7.2 K/uL (ref 1.7–7.7)
Neutrophils Relative %: 67 %
Platelets: 325 K/uL (ref 150–400)
RBC: 4.12 MIL/uL (ref 3.87–5.11)
RDW: 12.2 % (ref 11.5–15.5)
WBC: 10.5 K/uL (ref 4.0–10.5)
nRBC: 0 % (ref 0.0–0.2)

## 2023-10-07 LAB — COMPREHENSIVE METABOLIC PANEL WITH GFR
ALT: 13 U/L (ref 0–44)
AST: 19 U/L (ref 15–41)
Albumin: 4.1 g/dL (ref 3.5–5.0)
Alkaline Phosphatase: 95 U/L (ref 38–126)
Anion gap: 13 (ref 5–15)
BUN: 12 mg/dL (ref 6–20)
CO2: 21 mmol/L — ABNORMAL LOW (ref 22–32)
Calcium: 9.2 mg/dL (ref 8.9–10.3)
Chloride: 104 mmol/L (ref 98–111)
Creatinine, Ser: 0.67 mg/dL (ref 0.44–1.00)
GFR, Estimated: >60 mL/min (ref >60)
Glucose, Bld: 89 mg/dL (ref 70–99)
Potassium: 3.8 mmol/L (ref 3.5–5.1)
Sodium: 138 mmol/L (ref 135–145)
Total Bilirubin: 0.3 mg/dL (ref 0.0–1.2)
Total Protein: 6.7 g/dL (ref 6.5–8.1)

## 2023-10-07 LAB — TROPONIN T, HIGH SENSITIVITY
Troponin T High Sensitivity: <15 ng/L (ref 0–19)
Troponin T High Sensitivity: <15 ng/L (ref 0–19)

## 2023-10-07 LAB — D-DIMER, QUANTITATIVE: D-Dimer, Quant: <0.27 ug{FEU}/mL (ref 0.00–0.50)

## 2023-10-07 LAB — PREGNANCY, URINE: Preg Test, Ur: NEGATIVE

## 2023-10-07 NOTE — ED Provider Notes (Addendum)
 [place] EMERGENCY DEPARTMENT AT [facility] Provider Note   CSN: [id number] Arrival date & time: 10/07/23  1350     Patient presents with: Anxiety and Shortness of Breath   Sarah [name] is a 43 y.o. female.   Patient with acute onset of shortness of breath this morning.  Got very anxious over it.  Not really associated with any chest pain.  Patient states she feels better now.  Patient with a history of a non-STEMI admission September 08, 2023 and history of meth use.  Past medical history otherwise significant for hypertension anxiety ovarian cancer.  Everyday smoker.       Prior to Admission medications   Medication Sig Start Date End Date Taking? Authorizing Provider  aspirin  81 MG chewable tablet Chew 1 tablet (81 mg total) by mouth daily. 09/09/23 10/09/23  Willette Adriana LABOR, MD  lovastatin  (MEVACOR ) 20 MG tablet Take 1 tablet (20 mg total) by mouth at bedtime. 09/08/23 10/08/23  Shahmehdi, Seyed A, MD  metoprolol  tartrate (LOPRESSOR ) 25 MG tablet Take 1 tablet (25 mg total) by mouth 2 (two) times daily. 09/08/23 10/08/23  Willette Adriana LABOR, MD    Allergies: Patient has no known allergies.    Review of Systems  Constitutional:  Negative for chills and fever.  HENT:  Negative for ear pain and sore throat.   Eyes:  Negative for pain and visual disturbance.  Respiratory:  Positive for shortness of breath. Negative for cough.   Cardiovascular:  Negative for chest pain and palpitations.  Gastrointestinal:  Negative for abdominal pain and vomiting.  Genitourinary:  Negative for dysuria and hematuria.  Musculoskeletal:  Negative for arthralgias and back pain.  Skin:  Negative for color change and rash.  Neurological:  Negative for seizures and syncope.  Psychiatric/Behavioral:  The patient is nervous/anxious.   All other systems reviewed and are negative.   Updated Vital Signs BP (!) 168/116   Pulse 92   Temp 98.3 F (36.8 C)   Resp 19   LMP 08/30/2023  (Approximate)   SpO2 97%   Physical Exam Vitals and nursing note reviewed.  Constitutional:      General: She is not in acute distress.    Appearance: Normal appearance. She is well-developed.  HENT:     Head: Normocephalic and atraumatic.     Mouth/Throat:     Mouth: Mucous membranes are moist.  Eyes:     Conjunctiva/sclera: Conjunctivae normal.  Cardiovascular:     Rate and Rhythm: Normal rate and regular rhythm.     Heart sounds: No murmur heard. Pulmonary:     Effort: Pulmonary effort is normal. No respiratory distress.     Breath sounds: Normal breath sounds. No stridor. No wheezing, rhonchi or rales.  Abdominal:     Palpations: Abdomen is soft.     Tenderness: There is no abdominal tenderness.  Musculoskeletal:        General: No swelling.     Cervical back: Normal range of motion and neck supple.  Skin:    General: Skin is warm and dry.     Capillary Refill: Capillary refill takes less than 2 seconds.  Neurological:     General: No focal deficit present.     Mental Status: She is alert and oriented to person, place, and time.  Psychiatric:        Mood and Affect: Mood normal.     (all labs ordered are listed, but only abnormal results are displayed) Labs Reviewed  PREGNANCY,  URINE  CBC WITH DIFFERENTIAL/PLATELET  COMPREHENSIVE METABOLIC PANEL WITH GFR  TROPONIN T, HIGH SENSITIVITY    EKG: EKG Interpretation Date/Time:  Thursday October 07 2023 14:05:21 EDT Ventricular Rate:  90 PR Interval:  136 QRS Duration:  93 QT Interval:  365 QTC Calculation: 447 R Axis:   55  Text Interpretation: Sinus rhythm Borderline T abnormalities, inferior leads Confirmed by Midge Golas ([id number]) on 10/07/2023 2:14:27 PM  Radiology: No results found.   Procedures   Medications Ordered in the ED - No data to display                                  Medical Decision Making Amount and/or Complexity of Data Reviewed Labs: ordered. Radiology: ordered.   Patient  feeling much better currently.  Oxygen saturation is 100% on room air.  KG without any acute findings.  Likely due to the event of the non-STEMI at the end of July we will check cardiac markers and labs.  And get chest x-ray.  Blood pressure was elevated here at 168/116.  D-dimer 0.27.  Actually less than that.  So negative.  CBC white count 10.5 hemoglobin 13.4.  Platelets 325.  Complete metabolic panel electrolytes normal liver function test normal renal function normal.  Initial troponin less than 15 but since everything happened today we will get a delta troponin.  Pregnancy test negative chest x-ray without any acute findings.  Is waiting on delta troponin.  Delta opponent is also less than 15.  So patient stable for discharge home.  Final diagnoses:  SOB (shortness of breath)    ED Discharge Orders     None          Geraldene Hamilton, MD 10/07/23 1447    Geraldene Hamilton, MD 10/07/23 1526    Geraldene Hamilton, MD 10/07/23 1555    Geraldene Hamilton, MD 10/07/23 [phone]

## 2023-10-07 NOTE — ED Notes (Signed)
 Pt discharged home after verbalizing understanding of discharge instructions; nad noted.

## 2023-10-07 NOTE — ED Triage Notes (Signed)
 Pt c/o SOB and anxiety that started this morning. Hx of anxiety, currently not taking anything for it. Denies SOB at this time.

## 2023-10-07 NOTE — Discharge Instructions (Signed)
 Workup without any acute findings.  Return for any new or worse symptoms.  No evidence of blood clot no evidence of acute cardiac event.  And lungs were clear on chest x-ray.

## 2023-12-30 ENCOUNTER — Ambulatory Visit

## 2024-01-20 ENCOUNTER — Ambulatory Visit: Admitting: Family Medicine

## 2024-03-17 ENCOUNTER — Emergency Department (HOSPITAL_BASED_OUTPATIENT_CLINIC_OR_DEPARTMENT_OTHER)
Admission: EM | Admit: 2024-03-17 | Discharge: 2024-03-18 | Disposition: A | Payer: MEDICAID | Attending: Emergency Medicine | Admitting: Emergency Medicine

## 2024-03-17 ENCOUNTER — Other Ambulatory Visit: Payer: Self-pay

## 2024-03-17 ENCOUNTER — Encounter (HOSPITAL_BASED_OUTPATIENT_CLINIC_OR_DEPARTMENT_OTHER): Payer: Self-pay | Admitting: Emergency Medicine

## 2024-03-17 DIAGNOSIS — I10 Essential (primary) hypertension: Secondary | ICD-10-CM | POA: Insufficient documentation

## 2024-03-17 DIAGNOSIS — L03012 Cellulitis of left finger: Secondary | ICD-10-CM | POA: Diagnosis not present

## 2024-03-17 DIAGNOSIS — M79645 Pain in left finger(s): Secondary | ICD-10-CM | POA: Diagnosis present

## 2024-03-17 DIAGNOSIS — Z8543 Personal history of malignant neoplasm of ovary: Secondary | ICD-10-CM | POA: Insufficient documentation

## 2024-03-17 DIAGNOSIS — F419 Anxiety disorder, unspecified: Secondary | ICD-10-CM | POA: Insufficient documentation

## 2024-03-17 MED ORDER — OXYCODONE-ACETAMINOPHEN 5-325 MG PO TABS
1.0000 | ORAL_TABLET | Freq: Once | ORAL | Status: AC
  Administered 2024-03-18: 1 via ORAL
  Filled 2024-03-17: qty 1

## 2024-03-17 MED ORDER — LIDOCAINE HCL 2 % IJ SOLN
10.0000 mL | Freq: Once | INTRAMUSCULAR | Status: AC
  Administered 2024-03-18: 200 mg
  Filled 2024-03-17: qty 20

## 2024-03-17 NOTE — ED Triage Notes (Signed)
 Left middle finger pain swelling  My finger is infected Noticed yesterday Unable to remove false nail

## 2024-03-18 MED ORDER — DOXYCYCLINE HYCLATE 100 MG PO CAPS: 100.0000 mg | ORAL_CAPSULE | Freq: Two times a day (BID) | ORAL | 0 refills | Status: AC

## 2024-03-18 MED ORDER — LIDOCAINE HCL 2 % IJ SOLN
2.0000 mL | INTRAMUSCULAR | Status: DC
  Administered 2024-03-18: 2 mL

## 2024-03-18 NOTE — Discharge Instructions (Signed)
 You are seen today for an infection of the finger.  This is called a paronychia.  This is likely related to getting your fingernails manicured regularly.  Soak 2-3 times daily.  Take antibiotics as directed.  Avoid nailbiting.

## 2024-03-18 NOTE — ED Provider Notes (Signed)
 " [place] EMERGENCY DEPARTMENT AT [facility] Provider Note   CSN: [id number] Arrival date & time: 03/17/24  2005     Patient presents with: Hand Pain   Sarah [name] is a 44 y.o. female.   HPI     This 44 year old female who presents with left fourth digit pain.  Reports 2 to 3-day history of pain and swelling to the distal digit.  She does have acrylic nails and was unable to remove the nail.  Reports significant pain.  No drainage.  Concern for infection.  Prior to Admission medications  Medication Sig Start Date End Date Taking? Authorizing Provider  doxycycline  (VIBRAMYCIN ) 100 MG capsule Take 1 capsule (100 mg total) by mouth 2 (two) times daily. 03/18/24  Yes Estefano Victory, Charmaine FALCON, MD  lovastatin  (MEVACOR ) 20 MG tablet Take 1 tablet (20 mg total) by mouth at bedtime. 09/08/23 10/08/23  Willette Adriana LABOR, MD  metoprolol  tartrate (LOPRESSOR ) 25 MG tablet Take 1 tablet (25 mg total) by mouth 2 (two) times daily. 09/08/23 10/08/23  Willette Adriana LABOR, MD    Allergies: Keflex [cephalexin]    Review of Systems  Constitutional:  Negative for fever.  Musculoskeletal:        Finger pain and swelling  All other systems reviewed and are negative.   Updated Vital Signs BP (!) 146/92 (BP Location: Right Arm)   Pulse 86   Temp 98.1 F (36.7 C) (Oral)   Resp 18   LMP 03/16/2024   SpO2 97%   Physical Exam Vitals and nursing note reviewed.  Constitutional:      Appearance: She is well-developed. She is not ill-appearing.     Comments: Anxious appearing but nontoxic  HENT:     Head: Normocephalic and atraumatic.  Eyes:     Pupils: Pupils are equal, round, and reactive to light.  Cardiovascular:     Rate and Rhythm: Normal rate and regular rhythm.  Pulmonary:     Effort: Pulmonary effort is normal. No respiratory distress.  Abdominal:     Palpations: Abdomen is soft.  Musculoskeletal:     Cervical back: Neck supple.     Comments: Swelling to the distal aspect of  the digit adjacent to the nailbed medially with fluctuance noted, slight erythema  Skin:    General: Skin is warm and dry.  Neurological:     Mental Status: She is alert and oriented to person, place, and time.     (all labs ordered are listed, but only abnormal results are displayed) Labs Reviewed - No data to display  EKG: None  Radiology: No results found.   .Incision and Drainage: L ring finger  Date/Time: 03/18/2024 1:05 AM  Performed by: Bari Charmaine FALCON, MD Authorized by: Bari Charmaine FALCON, MD   Consent:    Consent obtained:  Verbal   Consent given by:  Patient   Risks discussed:  Bleeding, incomplete drainage and infection   Alternatives discussed:  No treatment Universal protocol:    Patient identity confirmed:  Verbally with patient Location:    Type:  Abscess   Size:  Small   Location:  Upper extremity   Upper extremity location:  Finger   Finger location:  L ring finger Pre-procedure details:    Skin preparation:  Povidone-iodine Sedation:    Sedation type:  None Anesthesia:    Anesthesia method:  Nerve block   Block anesthetic:  2 mL lidocaine  2 %   Block technique:  Digital block   Block injection  procedure:  Anatomic landmarks identified   Block outcome:  Anesthesia achieved Procedure type:    Complexity:  Simple Procedure details:    Needle aspiration: no     Incision types:  Stab incision   Drainage:  Purulent   Drainage amount:  Moderate   Packing materials:  None    Medications Ordered in the ED  oxyCODONE -acetaminophen  (PERCOCET/ROXICET) 5-325 MG per tablet 1 tablet (1 tablet Oral Given 03/18/24 0013)  lidocaine  (XYLOCAINE ) 2 % (with pres) injection 200 mg (200 mg Infiltration Given 03/18/24 0014)                                    Medical Decision Making Risk Prescription drug management.   This patient presents to the ED for concern of finger swelling and infection, this involves an extensive number of treatment options, and  is a complaint that carries with it a high risk of complications and morbidity.  I considered the following differential and admission for this acute, potentially life threatening condition.  The differential diagnosis includes felon, paronychia, cellulitis  MDM:    This is a 44 year old female who presents with pain and swelling to the left fourth digit.  Evidence of paronychia on exam.  This was incised at bedside with moderate drainage.  Recommend warm soaks and will start on doxycycline .  Discussed increased likelihood of infection with recurrent manicures.  (Labs, imaging, consults)  Labs: I Ordered, and personally interpreted labs.  The pertinent results include: N/A  Imaging Studies ordered: I ordered imaging studies including N/A I independently visualized and interpreted imaging. I agree with the radiologist interpretation  Additional history obtained from chart review.  External records from outside source obtained and reviewed including prior evaluations  Cardiac Monitoring: The patient was not maintained on a cardiac monitor.  If on the cardiac monitor, I personally viewed and interpreted the cardiac monitored which showed an underlying rhythm of: N/A  Reevaluation: After the interventions noted above, I reevaluated the patient and found that they have :improved  Social Determinants of Health:  lives independently  Disposition: Discharge  Co morbidities that complicate the patient evaluation  Past Medical History:  Diagnosis Date   Anxiety    Hypertension    Ovarian cancer (HCC)      Medicines Meds ordered this encounter  Medications   oxyCODONE -acetaminophen  (PERCOCET/ROXICET) 5-325 MG per tablet 1 tablet    Refill:  0   lidocaine  (XYLOCAINE ) 2 % (with pres) injection 200 mg   doxycycline  (VIBRAMYCIN ) 100 MG capsule    Sig: Take 1 capsule (100 mg total) by mouth 2 (two) times daily.    Dispense:  20 capsule    Refill:  0    I have reviewed the patients  home medicines and have made adjustments as needed  Problem List / ED Course: Problem List Items Addressed This Visit   None Visit Diagnoses       Paronychia of finger of left hand    -  Primary                Final diagnoses:  Paronychia of finger of left hand    ED Discharge Orders          Ordered    doxycycline  (VIBRAMYCIN ) 100 MG capsule  2 times daily        03/18/24 0044  Bari Charmaine FALCON, MD 03/18/24 [phone]  "
